# Patient Record
Sex: Female | Born: 1980
Health system: Southern US, Community
[De-identification: ages and names within clinical notes are randomized; demographics above are authoritative.]

## PROBLEM LIST (undated history)

## (undated) VITALS — BP 123/85 | HR 91 | Ht 67.99 in | Wt 172.0 lb

## (undated) HISTORY — PX: NECK SURGERY: SHX720

---

## 2012-11-06 ENCOUNTER — Ambulatory Visit: Payer: Self-pay

## 2012-11-06 ENCOUNTER — Telehealth: Payer: Self-pay

## 2012-11-06 DIAGNOSIS — M542 Cervicalgia: Secondary | ICD-10-CM
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
## 2012-11-06 NOTE — Telephone Encounter (Signed)
Please call the pt back to schedule aqua therapy appt at [phone]
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
## 2012-11-06 NOTE — Progress Notes (Signed)
Physical Therapy Exercise Flowsheet:  *Please refer to Physical Therapy Daily Flowsheet for further details of this session.*   11/06/12 1300   Cervical Exercises   Cervical Retraction, Sitting Comment HEP   Shoulder Exercises   Scapular Retractions, Sitting Comment HEP   Shoulder Circles Comment HEP   additional exercise Doorway Pec Stretch: HEP   Wallene Dales PT, DPT

## 2013-02-05 ENCOUNTER — Ambulatory Visit: Payer: Self-pay | Admitting: Physical Medicine and Rehabilitation

## 2013-04-05 ENCOUNTER — Other Ambulatory Visit: Payer: Self-pay | Admitting: Sports Medicine

## 2013-04-05 DIAGNOSIS — M25561 Pain in right knee: Secondary | ICD-10-CM

## 2013-04-06 ENCOUNTER — Ambulatory Visit: Payer: Self-pay | Admitting: Sports Medicine

## 2013-04-06 ENCOUNTER — Encounter: Payer: Self-pay | Admitting: Sports Medicine

## 2013-04-06 VITALS — BP 112/74 | Ht 68.0 in | Wt 176.0 lb
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
## 2013-04-09 ENCOUNTER — Ambulatory Visit: Payer: Self-pay | Admitting: Rehabilitative and Restorative Service Providers"

## 2013-04-09 DIAGNOSIS — M25569 Pain in unspecified knee: Secondary | ICD-10-CM

## 2013-04-09 NOTE — Progress Notes (Signed)
Physical Therapy Exercise Flowsheet:  *Please refer to Physical Therapy Daily Flowsheet for further details of this session.*     04/09/13 1500   Hip Exercises   Hip Exercises Yes   Hip Abduction Clam, Sidelying Comment HEP   Knee Exercises   Knee Exercises Yes   Quadriceps Set Comment 3 WAY - HEP   Straight Leg Raise Comment HEP     Tressie Ellis, PT, DPT
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
## 2013-04-16 ENCOUNTER — Ambulatory Visit: Payer: Self-pay
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
## 2013-04-16 NOTE — Progress Notes (Signed)
Physical Therapy Exercise Flowsheet:  *Please refer to Physical Therapy Daily Flowsheet for further details of this session.*   04/16/13 1530   Gym Equipment Exercises   Stationary Bike Comment no resistance, 8 min   Total time 8 min   Hip Exercises   Hip Exercises Yes   Hip Abduction Clam, Sidelying Comment 10 reps   Total time 2 min   Knee Exercises   Knee Exercises Yes   Quadriceps Set Comment 20x5sec   Terminal Knee Extension, Supine Comment 10 reps, 2 sets   additional exercise knee flexion stretch, 20 sec, 4 reps   Total time 15 ,om   Foot/Ankle Exercises   Heel Raises, Standing Comment 20 reps   Total time 5 min   Ronte Parker Eulis Foster, PTA

## 2013-04-22 ENCOUNTER — Ambulatory Visit: Payer: Self-pay

## 2013-04-22 NOTE — Progress Notes (Signed)
Physical Therapy Exercise Flowsheet:  *Please refer to Physical Therapy Daily Flowsheet for further details of this session.*   04/22/13 1538   Gym Equipment Exercises   Stationary Bike Comment no resistance, 8 min   Total time 8 min   Hip Exercises   Hip Exercises Yes   Hip Abduction Clam, Sidelying Comment 15 reps   Total time 2 min   Knee Exercises   Knee Exercises Yes   Hamstring Set Comment 20x5sec   Quadriceps Set Comment 20x5sec   Straight Leg Raise Comment 10 reps   Terminal Knee Extension, Supine Comment 20x5sec   additional exercise knee flexion stretch, 20 sec, 4 reps   additional exercise stairs, 20sec, 4 reps   Total time 20 min   Foot/Ankle Exercises   Heel Raises, Standing Comment 10 reps   Total time 3 min   Gatlyn Lipari Eulis Foster, PTA
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
## 2013-04-24 ENCOUNTER — Ambulatory Visit: Payer: Self-pay
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
## 2013-04-27 ENCOUNTER — Ambulatory Visit: Payer: Self-pay

## 2013-04-27 NOTE — Progress Notes (Signed)
Physical Therapy Exercise Flowsheet:  *Please refer to Physical Therapy Daily Flowsheet for further details of this session.*   04/27/13 1400   Gym Equipment Exercises   Stationary Bike Comment no resistance, 10 min   Total time 10 min   Hip Exercises   Hip Exercises Yes   Hip Abduction Clam, Sidelying Comment 15 reps   Total time 2 min   Knee Exercises   Knee Exercises Yes   Hamstring Set Comment 20x5sec   Quadriceps Set Comment 20x5sec   Straight Leg Raise Comment 10 reps   Terminal Knee Extension, Supine Comment 1 1/2#, 20 reps   additional exercise knee flexion stretch, 20 sec, 4 reps   additional exercise stairs, 20sec, 4 reps   Total time 22 min   Foot/Ankle Exercises   Heel Raises, Standing Comment 20 reps   Total time 3 min   Mabel Unrein Eulis Foster, PTA
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
## 2013-04-29 ENCOUNTER — Ambulatory Visit: Payer: Self-pay
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
## 2013-04-29 NOTE — Progress Notes (Signed)
Physical Therapy Exercise Flowsheet:  *Please refer to Physical Therapy Daily Flowsheet for further details of this session.*   04/29/13 1400   Gym Equipment Exercises   Stationary Bike Comment level1, 10 min   Total time 10 min   Hip Exercises   Hip Exercises Yes   Hip Abduction Clam, Sidelying Comment 15 reps   Total time 2 min   Knee Exercises   Knee Exercises Yes   Hamstring Set Comment 20x5sec   Quadriceps Set Comment 20x5sec   Straight Leg Raise Comment 10 reps   Terminal Knee Extension, Supine Comment 1 1/2#, 20 reps   additional exercise knee flexion stretch, 20 sec, 4 reps   additional exercise stairs, 20sec, 4 reps   Total time 22 min   Foot/Ankle Exercises   Heel Raises, Standing Comment 20 reps   Total time 3 min   Yonas Bunda Eulis Foster, PTA

## 2013-05-05 ENCOUNTER — Ambulatory Visit: Payer: Self-pay

## 2013-05-05 DIAGNOSIS — M25569 Pain in unspecified knee: Secondary | ICD-10-CM

## 2013-05-07 ENCOUNTER — Ambulatory Visit: Payer: Self-pay

## 2013-05-07 NOTE — Progress Notes (Addendum)
Department of Physical Medicine & Rehabilitation  Physical Therapy Progress Note    HISTORY:  Diagnosis:    Diagnosis: Right knee pain  Date of Surgery/Onset Date of Symptoms:        11/27/12  Referring Provider:        Glean Salvo, MD  Frequency:    Twice a week  Attendance:    Consistent  Patient's compliance with therapy and home exercise program:      good   Treatment:    AROM/PROM/Therapeutic exercise, Cold pack, Flexibility, Home exercise program instructions/Patient education, Patient/Family Education, Postural training/body Curator education, Strengthening    SUBJECTIVE:  Pain:    Improved     0-10 Scale: 6     OBJECTIVE:  ROM:   ROM  R Knee Flexion (0-140): 125  R Knee Extension (0): 0  Strength:   STRENGTH  Strength: Yes  R Hip Flexion : 4/5;4+/5  R Hip Extension : 4/5  R Hip Abduction : 4/5;4+/5  R Knee Flexion : 4+/5  R Knee Extension : 4+/5  Function:    Improved   Functional Tasks:     Improving with: Improving right knee ROM and decreased pain ambulation. Navigate stairs reciprocally with less pain.      Still difficulty with the following: Long distance walking and squatting     Functional Outcome Measures:    Self-Reported:  Lower Limb Functional scale:50%     Patient Education  Patient Education: Yes  Educated in disease process: Yes  Educated in home exercise program: Yes  Educated in need for compliance with use of pain management techniques: Yes   Educated in use of thermal agents: Yes       ASSESSMENT:  Short term goals achieved, continued PT recommended.    Short term goals (4 weeks):   1. Initiate HEP - Achieved  2. Independent with home thermal agents - Achieved  3. Tolerate >= 10 minutes standing - Achieved    Long term goals (8 weeks):    1. Independent with final HEP - On going  2. 5/5 right knee strength - partially achieved  3. Community ambulation without pain increase - on going/progressing  4. Stair negotiation without pain increase - partially achieved    Plan: Continue  PT     Sherrielyn Eulis Foster, PTA  Tressie Ellis, PT, DPT

## 2013-05-07 NOTE — Progress Notes (Signed)
Physical Therapy Exercise Flowsheet:  *Please refer to Physical Therapy Daily Flowsheet for further details of this session.*   05/05/13 1400   Gym Equipment Exercises   Stationary Bike Comment level 1, 10 min   Total time 10 min   Hip Exercises   Hip Exercises Yes   Hip Abduction Clam, Sidelying Comment 15 reps   Total time 2 min   Knee Exercises   Knee Exercises Yes   Hamstring Set Comment 20x5sec   Quadriceps Set Comment 20x5sec   Straight Leg Raise Comment 10 reps   Terminal Knee Extension, Supine Comment 1 1/2#, 20 reps   additional exercise knee flexion stretch on stairs, 20 sec, 4 reps   additional exercise stairs, 20sec, 4 reps   Total time 22 min   Foot/Ankle Exercises   Heel Raises, Standing Comment 20 reps   Total time 3 min   Jezreel Justiniano Eulis Foster, PTA
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
## 2013-05-08 ENCOUNTER — Ambulatory Visit: Payer: Self-pay
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
## 2013-05-08 NOTE — Progress Notes (Signed)
Physical Therapy Exercise Flowsheet:  *Please refer to Physical Therapy Daily Flowsheet for further details of this session.*   05/07/13 1338   Gym Equipment Exercises   Stationary Bike Comment level 1, 10 min   Total time 10 min   Hip Exercises   Hip Exercises Yes   Hip Abduction Clam, Sidelying Comment 15 reps   Total time 2 min   Knee Exercises   Knee Exercises Yes   Hamstring Set Comment 20x5sec   Quadriceps Set Comment 20x5sec   Straight Leg Raise Comment 10 reps   Terminal Knee Extension, Supine Comment 1 1/2#, 20 reps   additional exercise knee flexion stretch on stairs, 20 sec, 4 reps   additional exercise stairs, 20sec, 4 reps   Total time 22 min   Foot/Ankle Exercises   Heel Raises, Standing Comment 20 reps   Total time 3 min   Hesston Hitchens Eulis Foster, PTA

## 2013-05-13 ENCOUNTER — Telehealth: Payer: Self-pay

## 2013-05-13 ENCOUNTER — Ambulatory Visit: Payer: Self-pay

## 2013-05-13 NOTE — Telephone Encounter (Signed)
Patient called to cancel appointment for today due to the weather
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
## 2013-05-15 ENCOUNTER — Ambulatory Visit: Payer: Self-pay

## 2013-05-15 ENCOUNTER — Telehealth: Payer: Self-pay
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
## 2013-05-15 NOTE — Telephone Encounter (Signed)
The patient cancelled her 2:30 appointment for today.

## 2013-05-18 ENCOUNTER — Encounter: Payer: Self-pay | Admitting: Sports Medicine

## 2013-05-18 ENCOUNTER — Ambulatory Visit: Payer: Self-pay | Admitting: Sports Medicine

## 2013-05-18 VITALS — BP 110/69 | HR 67 | Wt 178.0 lb

## 2013-05-18 DIAGNOSIS — S8000XA Contusion of unspecified knee, initial encounter: Secondary | ICD-10-CM

## 2013-05-18 DIAGNOSIS — M25569 Pain in unspecified knee: Secondary | ICD-10-CM
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
## 2013-06-09 NOTE — Progress Notes (Addendum)
Department of Physical Medicine & Rehabilitation  Discharge Report    History:  Date of initial PT visit: 04/07/13  Date of last PT visit: 05/07/13  # of visits to date: 7  Diagnosis: Right knee pain  Subjective/Objective:  Patient hasn't attended PT in 4 weeks for unknown reasons    Assessment  Progress toward Program goals: Partially achieved or not achieved due to:   unknown  Progress towards Patient goals: Partially achieved or not achieved due to:   unknown     Plan  Discontinue Physical Therapy.    Thank you for the referral.  If you have any questions and/or concerns, please feel free to contact me at [phone].    Sherrielyn Eulis FosterAlberto, PTA    Tressie Ellisonald Mariadel Mruk, PT, DPT

## 2013-06-26 ENCOUNTER — Ambulatory Visit
Admit: 2013-06-26 | Discharge: 2013-06-26 | Disposition: A | Discharge status: Home / Self Care | Payer: Self-pay | Source: Ambulatory Visit | Attending: Neurosurgery | Admitting: Neurosurgery

## 2013-06-26 ENCOUNTER — Encounter: Payer: Self-pay | Admitting: Neurosurgery

## 2013-06-26 DIAGNOSIS — M47812 Spondylosis without myelopathy or radiculopathy, cervical region: Secondary | ICD-10-CM

## 2013-06-26 HISTORY — DX: Spondylosis without myelopathy or radiculopathy, cervical region: M47.812

## 2013-06-26 LAB — BASIC METABOLIC PANEL
Anion Gap: 9 (ref 7–16)
CO2: 25 mmol/L (ref 20–28)
Calcium: 9.5 mg/dL (ref 8.8–10.2)
Chloride: 102 mmol/L (ref 96–108)
Creatinine: 1.05 mg/dL — ABNORMAL HIGH (ref 0.51–0.95)
GFR,Black: 80 *
GFR,Caucasian: 69 *
Glucose: 79 mg/dL (ref 60–99)
Lab: 10 mg/dL (ref 6–20)
Potassium: 4.3 mmol/L (ref 3.3–5.1)
Sodium: 136 mmol/L (ref 133–145)

## 2013-06-26 LAB — PROTIME-INR
INR: 1 (ref 1.0–1.2)
Protime: 10.4 s (ref 9.2–12.3)

## 2013-06-26 LAB — CBC
Hematocrit: 40 % (ref 34–45)
Hemoglobin: 13.8 g/dL (ref 11.2–15.7)
MCH: 33 pg — ABNORMAL HIGH (ref 26–32)
MCHC: 34 g/dL (ref 32–36)
MCV: 95 fL (ref 79–95)
Platelets: 250 10*3/uL (ref 160–370)
RBC: 4.2 MIL/uL (ref 3.9–5.2)
RDW: 12.4 % (ref 11.7–14.4)
WBC: 6.4 10*3/uL (ref 4.0–10.0)

## 2013-06-26 LAB — TYPE AND SCREEN
ABO RH Blood Type: B POS
Antibody Screen: NEGATIVE

## 2013-06-26 LAB — APTT: aPTT: 31.9 s (ref 25.8–37.9)
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
## 2013-06-26 NOTE — Discharge Instructions (Addendum)
Pre-Operative Instructions Record                    HH [id number] MR    PRIOR TO SURGERY    Five days before surgery, you must STOP all aspirin, ibuprofen (Advil, Naproxen, Aleve, Motrin, etc.) and all vitamins and herbal supplements.     YOU MAY TAKE ACETAMINOPHEN (TYLENOL).    FOLLOW YOUR SURGEON'S INSTRUCTIONS IF DIFFERENT THAN ABOVE.      DAY BEFORE SURGERY  1/28    Call [phone] between 1PM and 4PM and select option #1 to receive your arrival/surgery time.        Do not eat anything (including candy or gum) after midnight the night before your surgery.  ____________________________________________________________________________    DAY OF SURGERY  1/29    Up to 4 hours before your surgery time, clear liquids are allowed (unless your doctor tells you differently).      Examples: black coffee or tea (no dairy or non-dairy creamer), soda, water, clear apple or cranberry juice.  No orange or tomato juice.     MEDICATIONS   PLEASE REFER TO THE MEDICATION LIST ON THE ATTACHED PAGE AND ONLY TAKE THE MEDICATIONS MARKED ON THAT LIST.   DO NOT TAKE ANY MEDICATIONS THAT WERE ALREADY STOPPED (ABOVE).   Bring and use inhalers as needed.   Pain and anxiety medications may be taken with a sip of water at any time.        DO NOT WEAR ANY RINGS, JEWELRY, MAKEUP, DARK NAIL POLISH, HAIR PINS, BODY LOTION OR SCENTS.      You may brush your teeth and use deodorant.  If wearing eyeglasses, please bring a case.  DO NOT WEAR CONTACT LENSES.     __ BRING CPAP IF USING       _X_ Skin prep and instructions given           __ Bring brace into the hospital the day of surgery  ________________________________________________________________________________    AT THE HOSPITAL  Park in the Main Ramp garage.  Report [facility] on [address].  Leave your belongings in the car and your visitors can bring them to your room after  surgery.    Any questions? Call [phone], select option 1, and ask to speak with a nurse or call your surgeon.    The patient  has participated in the development of this discharge plan and the above material has been reviewed.  Questions have been answered and he/she understands the contents of this plan and has received a copy of it.  Jeanella CaraJanene M Lyndell Allaire, RN 06/26/2013 1:06 PM        ON THE DAY OF YOUR SURGERY, FROM THE LIST OF MEDICATIONS BELOW, TAKE ONLY THOSE MEDICATIONS CHECKED.

## 2013-06-29 LAB — VITAMIN D
25-OH VIT D2: <4 ng/mL
25-OH VIT D3: 8 ng/mL
25-OH Vit Total: 8 ng/mL — ABNORMAL LOW (ref 30–60)

## 2013-07-02 ENCOUNTER — Other Ambulatory Visit: Payer: Self-pay | Admitting: Neurosurgery

## 2013-07-02 ENCOUNTER — Inpatient Hospital Stay
Admit: 2013-07-02 | Disposition: A | Discharge status: Home / Self Care | Payer: Self-pay | Source: Ambulatory Visit | Attending: Neurosurgery | Admitting: Neurosurgery

## 2013-07-02 ENCOUNTER — Encounter: Payer: Self-pay | Admitting: Neurosurgery

## 2013-07-02 DIAGNOSIS — M5030 Other cervical disc degeneration, unspecified cervical region: Secondary | ICD-10-CM | POA: Diagnosis present

## 2013-07-02 DIAGNOSIS — M542 Cervicalgia: Secondary | ICD-10-CM | POA: Diagnosis present

## 2013-07-02 DIAGNOSIS — M47812 Spondylosis without myelopathy or radiculopathy, cervical region: Secondary | ICD-10-CM

## 2013-07-02 DIAGNOSIS — M5020 Other cervical disc displacement, unspecified cervical region: Secondary | ICD-10-CM | POA: Diagnosis present

## 2013-07-02 DIAGNOSIS — F17200 Nicotine dependence, unspecified, uncomplicated: Secondary | ICD-10-CM | POA: Diagnosis present

## 2013-07-02 HISTORY — PX: OTHER SURGICAL HISTORY: SHX169

## 2013-07-02 LAB — POCT URINE PREGNANCY: Lot #: 104663

## 2013-07-02 LAB — POCT GLUCOSE
Glucose POCT: 105 mg/dL — ABNORMAL HIGH (ref 60–99)
Glucose POCT: 120 mg/dL — ABNORMAL HIGH (ref 60–99)

## 2013-07-02 MED ORDER — HYDROCODONE-ACETAMINOPHEN 5-325 MG PO TABS *I*
2.0000 | ORAL_TABLET | ORAL | Status: DC | PRN
Start: 2013-07-02 — End: 2013-07-03
  Administered 2013-07-02 – 2013-07-03 (×6): 2 via ORAL
  Filled 2013-07-02 (×6): qty 2

## 2013-07-02 MED ORDER — BUPIVACAINE-EPINEPHRINE 0.5 % IJ SOLN
INTRAMUSCULAR | Status: AC
Start: 2013-07-02 — End: 2013-07-02
  Filled 2013-07-02: qty 30

## 2013-07-02 MED ORDER — LACTATED RINGERS IV SOLN *I*
75.0000 mL/h | INTRAVENOUS | Status: DC
Start: 2013-07-02 — End: 2013-07-03
  Administered 2013-07-02: 75 mL/h via INTRAVENOUS

## 2013-07-02 MED ORDER — PROMETHAZINE HCL 25 MG/ML IJ SOLN *I*
6.2500 mg | Freq: Once | INTRAMUSCULAR | Status: DC | PRN
Start: 2013-07-02 — End: 2013-07-02
  Filled 2013-07-02: qty 1

## 2013-07-02 MED ORDER — EPHEDRINE SULFATE 50 MG/ML IJ SOLN WRAPPED *I*
Status: AC
Start: 2013-07-02 — End: 2013-07-02
  Filled 2013-07-02: qty 1

## 2013-07-02 MED ORDER — FENTANYL CITRATE 50 MCG/ML IJ SOLN *WRAPPED*
INTRAMUSCULAR | Status: AC
Start: 2013-07-02 — End: 2013-07-02
  Filled 2013-07-02: qty 2

## 2013-07-02 MED ORDER — DEXAMETHASONE SODIUM PHOSPHATE 4 MG/ML IJ SOLN
INTRAMUSCULAR | Status: AC
Start: 2013-07-02 — End: 2013-07-02
  Filled 2013-07-02: qty 1

## 2013-07-02 MED ORDER — SODIUM CHLORIDE 0.9 % INJ (FLUSH) WRAPPED *I*
Status: AC
Start: 2013-07-02 — End: 2013-07-02
  Filled 2013-07-02: qty 10

## 2013-07-02 MED ORDER — MENTHOL THROAT LOZENGE *I*
1.0000 | LOZENGE | OROMUCOSAL | Status: DC | PRN
Start: 2013-07-02 — End: 2013-07-03
  Administered 2013-07-02 – 2013-07-03 (×2): 1 via BUCCAL

## 2013-07-02 MED ORDER — POVIDONE-IODINE 5 % EX SOLN *I*
Freq: Once | CUTANEOUS | Status: AC
Start: 2013-07-02 — End: 2013-07-02

## 2013-07-02 MED ORDER — FENTANYL CITRATE 50 MCG/ML IJ SOLN *WRAPPED*
INTRAMUSCULAR | Status: AC
Start: 2013-07-02 — End: 2013-07-02
  Filled 2013-07-02: qty 4

## 2013-07-02 MED ORDER — ROCURONIUM BROMIDE 10 MG/ML IV SOLN *WRAPPED*
Status: AC
Start: 2013-07-02 — End: 2013-07-02
  Filled 2013-07-02: qty 5

## 2013-07-02 MED ORDER — ONDANSETRON HCL 2 MG/ML IV SOLN *I*
4.0000 mg | Freq: Four times a day (QID) | INTRAMUSCULAR | Status: DC | PRN
Start: 2013-07-02 — End: 2013-07-03

## 2013-07-02 MED ORDER — NEOSTIGMINE METHYLSULFATE 1 MG/ML IJ SOLN WRAPPED *I*
Status: AC
Start: 2013-07-02 — End: 2013-07-02
  Filled 2013-07-02: qty 10

## 2013-07-02 MED ORDER — ONDANSETRON HCL 2 MG/ML IV SOLN *I*
INTRAMUSCULAR | Status: AC
Start: 2013-07-02 — End: 2013-07-02
  Filled 2013-07-02: qty 2

## 2013-07-02 MED ORDER — LACTATED RINGERS IV SOLN *I*
20.0000 mL/h | INTRAVENOUS | Status: DC
Start: 2013-07-02 — End: 2013-07-02
  Administered 2013-07-02: 20 mL/h via INTRAVENOUS

## 2013-07-02 MED ORDER — ZOLPIDEM TARTRATE 5 MG PO TABS *I*
5.0000 mg | ORAL_TABLET | Freq: Once | ORAL | Status: AC
Start: 2013-07-02 — End: 2013-07-02
  Administered 2013-07-02: 5 mg via ORAL
  Filled 2013-07-02: qty 1

## 2013-07-02 MED ORDER — LIDOCAINE HCL 1 % IJ SOLN
0.1000 mL | INTRAMUSCULAR | Status: DC | PRN
Start: 2013-07-02 — End: 2013-07-02
  Administered 2013-07-02: 0.1 mL via SUBCUTANEOUS
  Filled 2013-07-02: qty 2

## 2013-07-02 MED ORDER — CEFAZOLIN 2000 MG IN 100 ML D5W *I*
2000.0000 mg | INTRAVENOUS | Status: AC
Start: 2013-07-02 — End: 2013-07-02
  Administered 2013-07-02: 2000 mg via INTRAVENOUS
  Filled 2013-07-02: qty 1

## 2013-07-02 MED ORDER — DOCUSATE SODIUM 100 MG PO CAPS *I*
100.0000 mg | ORAL_CAPSULE | Freq: Two times a day (BID) | ORAL | Status: DC
Start: 2013-07-02 — End: 2013-07-03
  Administered 2013-07-02 – 2013-07-03 (×2): 100 mg via ORAL
  Filled 2013-07-02 (×2): qty 1

## 2013-07-02 MED ORDER — MIDAZOLAM HCL 1 MG/ML IJ SOLN *I* WRAPPED
INTRAMUSCULAR | Status: AC
Start: 2013-07-02 — End: 2013-07-02
  Filled 2013-07-02: qty 2

## 2013-07-02 MED ORDER — LACTATED RINGERS IV SOLN *I*
125.0000 mL/h | INTRAVENOUS | Status: DC
Start: 2013-07-02 — End: 2013-07-02
  Administered 2013-07-02: 125 mL/h via INTRAVENOUS

## 2013-07-02 MED ORDER — KETOROLAC TROMETHAMINE 30 MG/ML IJ SOLN *I*
INTRAMUSCULAR | Status: AC
Start: 2013-07-02 — End: 2013-07-02
  Filled 2013-07-02: qty 1

## 2013-07-02 MED ORDER — ERGOCALCIFEROL 50000 UNIT PO CAPS *I*
50000.0000 [IU] | ORAL_CAPSULE | Freq: Every day | ORAL | Status: AC
Start: 2013-07-03 — End: 2013-07-03
  Administered 2013-07-03: 50000 [IU] via ORAL
  Filled 2013-07-02 (×2): qty 1

## 2013-07-02 MED ORDER — HYDROCODONE-ACETAMINOPHEN 5-325 MG PO TABS *I*
1.0000 | ORAL_TABLET | ORAL | Status: DC | PRN
Start: 2013-07-02 — End: 2013-07-03

## 2013-07-02 MED ORDER — PHENOL 1.4 % MT LIQD *I*
1.0000 | OROMUCOSAL | Status: DC | PRN
Start: 2013-07-02 — End: 2013-07-03
  Administered 2013-07-03: 1 via ORAL
  Filled 2013-07-02: qty 177

## 2013-07-02 MED ORDER — PROPOFOL 10 MG/ML IV EMUL (INTERMITTENT DOSING) WRAPPED *I*
INTRAVENOUS | Status: AC
Start: 2013-07-02 — End: 2013-07-02
  Filled 2013-07-02: qty 20

## 2013-07-02 MED ORDER — HYDROMORPHONE HCL PF 1 MG/ML IJ SOLN *WRAPPED*
INTRAMUSCULAR | Status: AC
Start: 2013-07-02 — End: 2013-07-02
  Filled 2013-07-02: qty 1

## 2013-07-02 MED ORDER — MEPERIDINE HCL 25 MG/ML IJ SOLN *I*
12.5000 mg | INTRAMUSCULAR | Status: DC | PRN
Start: 2013-07-02 — End: 2013-07-02

## 2013-07-02 MED ORDER — SENNOSIDES 8.6 MG PO TABS *I*
2.0000 | ORAL_TABLET | Freq: Every evening | ORAL | Status: DC
Start: 2013-07-02 — End: 2013-07-03
  Administered 2013-07-02: 2 via ORAL
  Filled 2013-07-02: qty 2

## 2013-07-02 MED ORDER — LIDOCAINE HCL 2 % (PF) IJ SOLN *I*
INTRAMUSCULAR | Status: AC
Start: 2013-07-02 — End: 2013-07-02
  Filled 2013-07-02: qty 5

## 2013-07-02 MED ORDER — DIAZEPAM 5 MG PO TABS *I*
5.0000 mg | ORAL_TABLET | Freq: Four times a day (QID) | ORAL | Status: DC | PRN
Start: 2013-07-02 — End: 2013-07-03
  Administered 2013-07-02 – 2013-07-03 (×4): 5 mg via ORAL
  Filled 2013-07-02 (×4): qty 1

## 2013-07-02 MED ORDER — HYDROMORPHONE HCL PF 1 MG/ML IJ SOLN *WRAPPED*
0.4000 mg | INTRAMUSCULAR | Status: DC | PRN
Start: 2013-07-02 — End: 2013-07-02
  Administered 2013-07-02 (×5): 0.4 mg via INTRAVENOUS
  Filled 2013-07-02 (×2): qty 1

## 2013-07-02 MED ORDER — THROMBIN (RECOMBINANT) 5000 UNIT EX SOLR *I* WRAPPED
CUTANEOUS | Status: AC
Start: 2013-07-02 — End: 2013-07-02
  Filled 2013-07-02: qty 2

## 2013-07-02 MED ORDER — SUCCINYLCHOLINE CHLORIDE 20 MG/ML IV/IJ SOLN *WRAPPED*
Status: AC
Start: 2013-07-02 — End: 2013-07-02
  Filled 2013-07-02: qty 10

## 2013-07-02 NOTE — Progress Notes (Signed)
Patient arrived to PACU, report from Dr. Carlena Bjornstadurle.  VSS.  Patient awake, alert, and oriented.  Patient rating pain 9/10, states it is the same as her baseline pain.  BG checked, found to be 120mg /dL.  No treatment necessary per Dr. Carlena Bjornstadurle.  Will continue to monitor until patient meets criteria for discharge from PACU.  Saunders GlanceElizabeth Angeldejesus Callaham, RN

## 2013-07-02 NOTE — Preop H&P (Signed)
UPDATES TO PATIENT'S CONDITION on the DAY OF SURGERY/PROCEDURE    I. Updates to Patient's Condition (to be completed by a provider privileged to complete a H&P, following reassessment of the patient by the provider):    Full H&P done today; no updates needed.            II. Procedure Readiness   I have reviewed the patient's H&P and updated condition. By completing and signing this form, I attest that this patient is ready for surgery/procedure.      III. Attestation   I have reviewed the updated information regarding the patient's condition and it is appropriate to proceed with the planned surgery/procedure.    Mariana SingleHRISTIE M Aniket Paye, MD as of 8:11 AM 07/02/2013

## 2013-07-02 NOTE — INTERIM OP NOTE (Signed)
Interim Op Note    Date of Surgery: 07/02/2013  Surgeon: McMorrow, Cathlean Sauerhristie M, MD    First Assistant: Wendee CoppMeghan Syria Kestner, PA      Pre-Op Diagnosis: cervical spondylosis    Anesthesia Type: General    Post-Op Diagnosis:    Primary: same      Additional Findings (Including unexpected complications): none    Procedure(s) Performed (including CPT 4 Code if available)   ACDF C4-C7    Estimated Blood Loss: 30 cc  Packing: No  Drains: No  Fluid Totals: Intakes: 1100 cc Outputs: 200 cc  Specimens to Pathology: no  Patient Condition: good

## 2013-07-02 NOTE — Progress Notes (Signed)
preop vitamin d 8    Ergocalciferol 50000units 07/03/13  Start supplement at discharge  Vitamin D3 10000 units per week      [facility] Surgery CenterChristie McMorrowMD

## 2013-07-02 NOTE — Anesthesia Pre-procedure Eval (Addendum)
Anesthesia Pre-operative Evaluation for [place] History  Past Medical History   Diagnosis Date    Cervical spondylosis without myelopathy 06/26/2013     No past surgical history on file.  Social History  History   Substance Use Topics    Smoking status: Current Every Day Smoker -- 0.05 packs/day    Smokeless tobacco: Never Used    Alcohol Use: Yes      Comment: beer daily      History   Drug Use    Yes    Special: Marijuana     ______________________________________________________________________  Allergies: No Known Allergies (drug, envir, food or latex)  Prior to Admission Medications              Last Dose Start Date End Date Provider     Pediatric Multivit-Minerals-C (RA GUMMY VITAMINS & MINERALS PO) 1 week  --  --  [provider]        Current Facility-Administered Medications   Medication    Lactated Ringers Infusion    lidocaine 1 % injection 0.1 mL    ceFAZolin 2 g in D5W IVPB    povidone-iodine 5 % Skin Preparation SOLN    Lactated Ringers Infusion    HYDROmorphone PF (DILAUDID) injection 0.4 mg    promethazine (PHENERGAN) injection 6.25 mg    meperidine (DEMEROL) injection 12.5 mg     Admission Medications:  Scheduled Meds   cefazolin IV      povidone-iodine     IV Meds   lactated ringers      lactated ringers     PRN Meds   lidocaine      HYDROmorphone PF      promethazine      meperidine       Anesthesia Evaluation Information Source: per patient, per records  GENERAL     Denies general issues  Pertinent (-):  history of anesthetic complications, FamHx of anesthetic complications    HEENT  Denies HEENT issues    PULMONARY    + Smoker            currently, advised to quit    + Recent URI (nasal congestion, no fever, no productive cough) CARDIOVASCULAR  Denies cardiovascular issues  Good(4+METs) Exercise Tolerance    GI/HEPATIC/RENAL     Denies GI/hepatic/renal issues  Last PO Intake: >8hr before procedure     NEURO/PSYCH    Comment: Bilateral upper  extremity pain and numbness L>R, no exacerbation with motion in neck    ENDO/OTHER    Denies endo issues  Pertinent (-):  diabetes mellitus    HEMATOLOGIC  Denies hematologic issues     Nursing Reported PO Status: Date Last PO Fluids: 07/02/13 0630  Date Last PO Solids: 07/01/13 2300  ______________________________________________________________________  Physical Exam    Airway            Mouth opening: normal            Mallampati: I            TM distance (fb): >3 FB            Neck ROM: full  Dental   Normal Exam   Cardiovascular  Normal Exam     Pulmonary   pulmonary exam normal    Mental Status     + Anxious         Most Recent Vitals: BP: 157/93 mmHg (07/02/13 0705)  Heart Rate: 86 (07/02/13 0705)  Temp: 36.7 C (98.1 F) (07/02/13 0705)  Resp: 16 (07/02/13 0705)  Height: 172.7 cm ($RemoveB'5\' 8"'MxFgZKwo$ ) (07/02/13 0705)  Weight: 78.019 kg (172 lb) (07/02/13 0705)  BMI (Calculated): 26.2 (07/02/13 0705)  BSA (Calculated - sq m): 1.93 sq meters (07/02/13 0705)  SpO2: 97 % (07/02/13 0705)    Vital Sign Ranges (last 24hrs)  Temp:  [36.7 C (98.1 F)] 36.7 C (98.1 F)  Heart Rate:  [86] 86  Resp:  [16] 16  BP: (157)/(93) 157/93 mmHg        Most Recent Lab Results   Blood Type  Lab Results   Component Value Date    ABORH B RH POS 06/26/2013    ABS Negative 06/26/2013   CBC  Lab Results   Component Value Date    WBC 6.4 06/26/2013    HCT 40 06/26/2013    PLT 250 06/26/2013   Chem-7  Lab Results   Component Value Date    NA 136 06/26/2013    K 4.3 06/26/2013    CL 102 06/26/2013    CO2 25 06/26/2013    UN 10 06/26/2013    CREAT 1.05* 06/26/2013    GLU 79 06/26/2013   Estimated Creatinine Clearance: 76.16 ml/min (based on Cr of 1.05).  Electrolytes  Lab Results   Component Value Date    CA 9.5 06/26/2013   Coags  Lab Results   Component Value Date    PTI 10.4 06/26/2013    INR 1.0 06/26/2013    PTT 31.9 06/26/2013   LFTs  No results found for this basename: AST, ALT, ALK      Pregnancy Status: Postmenarcheal [5]  Patient's last menstrual period was  07/02/2013.    Lab Results   Component Value Date    PUPT Negative-Dilute urine specimens may cause false negative urine pregnancy results... 07/02/2013     ECG Results  No results found for this basename: rate, PR, statement     ANES CPM    Radiology: No relevant studies     ________________________________________________________________________  Medical Problems  Patient Active Problem List    Diagnosis Date Noted    Cervical spondylosis without myelopathy 06/26/2013    Pain in joint, lower leg 04/06/2013    Contusion of knee 04/06/2013    Patellar pain 04/06/2013       PreOp/PreProcedure Diagnosis (For more detail see procedural consent)            Cervical spondylolisthesis  Planned Procedure (For more detail see procedural consent)            ACDF C4-7  Plan   ASA Score 2  Anesthetic Plan (general); Induction (routine IV); Airway (cuffed ETT); Line ( use current access); Monitoring (standard ASA); Positioning (supine); Pain (per surgical team); PostOp (PACU)    Informed Consent     Risks:          Risks discussed were commensurate with the plan listed above with the following specific points:  N/V, sore throat, unexpected serious injury    Anesthetic Consent:         Anesthetic plan and risks discussed with:  patient      Attending Attestation: The patient or proxy understand and accept the risks and benefits of the anesthesia plan. By accepting this note, I attest that I have personally performed the history and physical exam and prescribed the anesthetic plan within 48 hours prior to the anesthetic as documented by me above.    Author: Will Bonnet,  MD

## 2013-07-02 NOTE — Discharge Instructions (Signed)
Post-op Instructions:  Anterior Cervical Decompression and Fusion  Dr. McMorrow [phone]    Wound Care  Keep incision clean and dry   Wash with soap and water, dry with towel  Do not remove paper steri -strips  Do not remove or cut blue sutures  Men may shave neck around area of paper steri-strips  Do not apply any creams, ointments, etc to incision    General Instructions  Wear collar when out of bed  The collar should fit snug and restrict the motion of your neck  You may shower  Do not swim or immerse the incision in water  Use the incentive Spirometer (plastic blow box) 4 times per day for 2 weeks  Walk thirty (30) minutes every morning and evening    Do not use hand weights   Walk on easy terrain  Do not smoke cigarettes or use nicotine products  Do not use non-steroidal anti-inflammatory agents (NSAID’s) ie Motrin, Advil, Naprosyn, ibuprofen, etc  Eat a well balanced diet  Your throat may be sore for a week   Try to eat easy to swallow foods   Drink liquids with meals  Do not drive  Do not lift anything heavier than 5 pounds  No strenuous or contact sports  You will be given a prescription for pain medication; this medication should last until your return visit  You may only require Tylenol for pain relief    Call Dr. McMorrow if any of the following occur  You develop a fever (temperature greater than 101.5), chills, nausea, vomiting  Your incision is red or starts to drain fluid  Your incision is excessively painful  You develop excessive neck pain or difficulty swallowing  Your arms or legs become weak or painful  You are concerned that something is wrong or do not understand activity restrictions    Return Visit  You will be seen in clinic 2 weeks after your surgery  Call office if you do not have 2 week follow up visit scheduled  You will need Xrays prior to your visit  Bring all your medications to follow up visits    Subsequent Care  Please take Vitamin D3 as prescribed  You may be prescribed a  Bone Growth Stimulator;  wear device daily as instructed

## 2013-07-02 NOTE — Plan of Care (Signed)
Problem: Nutrition  Goal: Patients nutritional status is maintained or improved  Outcome: Progressing towards goal  Pt having difficulty swallowing 2/2 throat pain; throat lozenges requested from covering provider and prn order placed. Ice chips given to pt as well as lozenge, both provide some relief. Pt states she is hungry, but having difficulty tolerating regular diet 2/2 swallowing pain. Milkshake made which pt able to tolerate. Prn pain meds admin per MAR> Continue to monitor and assess.
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
## 2013-07-02 NOTE — Progress Notes (Addendum)
Progress Note    S: The patient was irritated that so many people are in and out of her room checking on her. It was explained that she just had surgery and that it is the protocol, and overnight she should be able to sleep more. She states that the pain that she was experiencing in her Left arm and right shoulder is still there, but denies any new numbness or tingling.  Has some difficulty with swallowing, but able to get her medication down. States that she has not been out of bed at this point. Denies any CP, SOB, N/V, or any new paresthesais.     O:BP 119/79   Pulse 59   Temp(Src) 36.8 C (98.2 F) (Temporal)   Resp 16   Ht 1.727 m (5\' 8" )   Wt 78.019 kg (172 lb)   BMI 26.16 kg/m2   SpO2 96%   LMP 07/02/2013  General: NAD while in bed  Neck: dressing C/D/I  UE: Strength +3/5 on left, +4/5 on right, sensation grossly intact bilaterally   Heart: RRR  Lungs: CTAB  Abdomen: +BS, soft round, non tender  No calve tenderness      Intake/Output Summary (Last 24 hours) at 07/02/13 1436  Last data filed at 07/02/13 1137   Gross per 24 hour   Intake   1260 ml   Output    230 ml   Net   1030 ml     Recent Results (from the past 24 hour(s))   POCT URINE PREGNANCY    Collection Time     07/02/13  7:38 AM       Result Value Range    Preg Test,UR POC    Negative-Dilute urine specimens may cause false negative urine pregnancy results...    Value: Negative-Dilute urine specimens may cause false negative urine pregnancy results...    INTERNAL CONTROL POCT URINE PREGNANCY *Yes-internal procedural control(s) acceptable      Exp date 01/26/14      Lot # [id number]     POCT GLUCOSE    Collection Time     07/02/13  7:55 AM       Result Value Range    Glucose POCT 105 (*) 60 - 99 mg/dL   POCT GLUCOSE    Collection Time     07/02/13 11:03 AM       Result Value Range    Glucose POCT 120 (*) 60 - 99 mg/dL     A/P: POD 0 S/P ACDF C4-7  -Cervical films this evening  -encouraged ambulation and IS  -DAT  -PO pain management    -Ergocalciferol  50000units 07/03/13,Start supplement at discharge, Vitamin D3 10000 units per week per Dr.McMorrow  -likely home in the am     Rosalita LevanSamantha Makinley Muscato, [place]PA  NPI # [id number]  Pager # [phone]

## 2013-07-02 NOTE — Plan of Care (Signed)
Patient meets criteria for discharge from PACU.  VSS.  Patient rates pain 8/10, denies nausea.  Patient states pain is better than it is on a daily basis at home.  Patient confirmed to E703.  Brief report to Reece Levyeb, Charity fundraiserN.  Please call Marisue IvanLiz at [phone] with questions.  Saunders GlanceElizabeth Jacier Gladu, RN

## 2013-07-02 NOTE — Plan of Care (Signed)
Problem: Safety  Goal: Patient will remain free of falls  Outcome: Maintaining  Call bell within reach, pt uses call bell appropriately. Pt free of falls this shift.    Problem: Pain/Comfort  Goal: Patients pain or discomfort is manageable  Outcome: Progressing towards goal  PRN pain medications and ice packs used for neck/throat pain with some positive effects. Will continue to monitor.    Problem: Mobility  Goal: Patients functional status is maintained or improved  Outcome: Progressing towards goal  Pt is a standby assist due to the IV pole and risk of falls from narcotics she is taking for pain management.

## 2013-07-03 MED ORDER — VITAMIN D3 10000 UNIT PO CAPS *A*
10000.0000 [IU] | ORAL_CAPSULE | ORAL | Status: DC
Start: 2013-07-03 — End: 2022-11-16

## 2013-07-03 MED ORDER — DOCUSATE SODIUM 100 MG PO CAPS *I*
100.0000 mg | ORAL_CAPSULE | Freq: Two times a day (BID) | ORAL | Status: DC
Start: 2013-07-03 — End: 2022-11-16

## 2013-07-03 MED ORDER — DIAZEPAM 5 MG PO TABS *I*
5.0000 mg | ORAL_TABLET | Freq: Four times a day (QID) | ORAL | Status: DC | PRN
Start: 2013-07-03 — End: 2022-11-16

## 2013-07-03 MED ORDER — HYDROCODONE-ACETAMINOPHEN 5-325 MG PO TABS *I*
1.0000 | ORAL_TABLET | ORAL | Status: DC | PRN
Start: 2013-07-03 — End: 2022-11-16

## 2013-07-03 NOTE — Progress Notes (Signed)
Surgery PA: POD#1 anterior cervical discectomy with fusion C4-5, 5-6, 6-7    S: Feels "shi---". C/O pain in posterior neck, shoulders; mod odynophagia. LUE "still numb" but no worse compared to pre-op. Ambulating about room, to BR. + voids. Dizzy only first time OOB.     O:BP 109/66   Pulse 67   Temp(Src) 37 C (98.6 F) (Temporal)   Resp 16   Ht 1.727 m (5\' 8" )   Wt 78.019 kg (172 lb)   BMI 26.16 kg/m2   SpO2 99%   LMP 07/02/2013                 Intake/Output Summary (Last 24 hours) at 07/03/13 0734  Last data filed at 07/03/13 0640   Gross per 24 hour   Intake 3048.75 ml   Output    805 ml   Net 2243.75 ml           Pt awake and alert, in NAD. Skin warm and dry. Voice clear, strong       Neck wound flat, steri-strips in place. Approp tenderness surrounding incision       Chest is CTAB       Heart with RRR       Grips fair, R>>L. Good shrug       Calves soft, nontender     xrays reviewed, look good    A/P: Cheryl Rush is a 33 y.o. female who is POD#1 in stable condition  -d/c restrictions, instructions r/w pt  -f/u with Dr. Sonnie AlamoMcMorrow in 2 weeks and PRN    Chryl HeckJessica R. Leary RocaMahoney, RPA-C  NPI # [id number]  Pager # [phone]

## 2013-07-03 NOTE — Progress Notes (Signed)
Utilization Management    Level of Care Inpatient as of the date [id number]      Merari Pion J Johnpaul Gillentine, RN     Pager: [phone]

## 2013-07-03 NOTE — Discharge Summary (Signed)
Name: Cheryl Rush Plaisted MRN: [id number] DOB: 11/22/1980     Admit Date: 07/02/2013   Date of Discharge: 07/03/2013    Discharge Attending Physician: Mariana SingleMCMORROW, CHRISTIE M      Hospitalization Summary    CONCISE NARRATIVE: Cheryl Rush Ryker was admitted after the planned surgery. She progressed well following. Her pain was managed with oral Norco and Valium PRN. She was tolerating liquids and soft foods well, ambulating and voiding with ease. There was no evidence of hematoma. She was stable for discharge home on post-op day #1.       OR PROCEDURE: Anterior cervical discectomy with fusion C4-5, 5-6, 6-7      Signed: Marlin Jarrard, PA  On: 07/03/2013  at: 8:17 AM

## 2013-07-03 NOTE — Plan of Care (Signed)
Problem: Pain/Comfort  Goal: Patients pain or discomfort is manageable  Outcome: Adequate for discharge Date Met:  07/03/13  Reviewed pain management for d/c w pt, questions answered to the best of writer's ability. Safe for d/c home.

## 2013-07-03 NOTE — Progress Notes (Signed)
Feeling better now  Neck is sore in back  No shocks into arms when she moves neck  Some persistent numbness  Hurts to swallow    Incision clean and dry  Voice ok  amb ind  Can elevate arms over head    Sp acdf 45 56 67  Doing ok  Encouraged to eat soft foods  No smoking  Numbness may persist   Pain seems to be a little better  Fu office 2 weeks  Start Vitamin D supplement    [facility] CfChristie McMorrowMD

## 2013-07-03 NOTE — Anesthesia Post-procedure Eval (Signed)
Anesthesia Post-op Note    Patient: Cheryl Rush    Procedure(s) Performed: ACDF C4-7    Anesthesia type: General    Patient location: Med Surgical Floor    Mental Status: Recovered to baseline    Patient able to participate in this evaluation: yes  Last Vitals: BP: 109/66 mmHg (07/03/13 0607)  BP MAP : 92 mmHg (07/02/13 1145)  Heart Rate: 67 (07/03/13 0607)  Temp: 37 C (98.6 F) (07/03/13 96040607)  Resp: 16 (07/03/13 0607)  Height: 172.7 cm (5\' 8" ) (07/02/13 0705)  Weight: 78.019 kg (172 lb) (07/02/13 0705)  BMI (Calculated): 26.2 (07/02/13 0705)  BSA (Calculated - sq m): 1.93 sq meters (07/02/13 0705)  SpO2: 99 % (07/03/13 0607)      Post-op vital signs noted above are within patients normal range  Post-op vitals signs: stable  Respiratory function: baseline    Airway patent: Yes    Cardiovascular and hydration status stable: Yes    Post-Op pain: Adequate analgesia    Post-Op nausea and vomiting: none    Post-Op assessment: no apparent anesthetic complications, tolerated procedure well and no evidence of recall    Complications: none, pt doing very well post op    Attending Attestation: All indicated post anesthesia care provided    Author: Junius ArgyleALAN E Floyde Dingley, MD  as of: 07/03/2013  at: 8:27 AM

## 2013-07-03 NOTE — Plan of Care (Signed)
Problem: Pain/Comfort  Goal: Patients pain or discomfort is manageable  Outcome: Progressing towards goal  Pt reports 9/10 pain, but sleeps in between doses of pain medicine.  Most pain complaint of sore throat.  Cepastat lozenge given this morning.

## 2013-11-23 ENCOUNTER — Encounter: Payer: Self-pay | Admitting: Neurosurgery

## 2014-03-08 ENCOUNTER — Encounter: Payer: Self-pay | Admitting: Gastroenterology

## 2014-03-15 ENCOUNTER — Ambulatory Visit: Payer: Self-pay | Admitting: Pain Medicine

## 2014-03-18 ENCOUNTER — Ambulatory Visit: Payer: Self-pay | Admitting: Pain Medicine

## 2014-03-31 ENCOUNTER — Ambulatory Visit: Payer: Self-pay | Admitting: Pain Medicine

## 2014-05-13 ENCOUNTER — Ambulatory Visit: Payer: Self-pay | Admitting: Pain Medicine

## 2016-03-06 ENCOUNTER — Encounter (HOSPITAL_COMMUNITY): Payer: Self-pay | Admitting: Emergency Medicine

## 2016-03-06 ENCOUNTER — Emergency Department (HOSPITAL_COMMUNITY)
Admission: EM | Admit: 2016-03-06 | Discharge: 2016-03-06 | Disposition: A | Discharge status: Home / Self Care | Payer: Self-pay | Attending: Emergency Medicine | Admitting: Emergency Medicine

## 2016-03-06 DIAGNOSIS — Y999 Unspecified external cause status: Secondary | ICD-10-CM | POA: Insufficient documentation

## 2016-03-06 DIAGNOSIS — B9689 Other specified bacterial agents as the cause of diseases classified elsewhere: Secondary | ICD-10-CM | POA: Insufficient documentation

## 2016-03-06 DIAGNOSIS — Y939 Activity, unspecified: Secondary | ICD-10-CM | POA: Insufficient documentation

## 2016-03-06 DIAGNOSIS — G8929 Other chronic pain: Secondary | ICD-10-CM | POA: Insufficient documentation

## 2016-03-06 DIAGNOSIS — F17200 Nicotine dependence, unspecified, uncomplicated: Secondary | ICD-10-CM | POA: Insufficient documentation

## 2016-03-06 DIAGNOSIS — N760 Acute vaginitis: Secondary | ICD-10-CM | POA: Insufficient documentation

## 2016-03-06 DIAGNOSIS — W57XXXA Bitten or stung by nonvenomous insect and other nonvenomous arthropods, initial encounter: Secondary | ICD-10-CM | POA: Insufficient documentation

## 2016-03-06 DIAGNOSIS — Y929 Unspecified place or not applicable: Secondary | ICD-10-CM | POA: Insufficient documentation

## 2016-03-06 DIAGNOSIS — S80861A Insect bite (nonvenomous), right lower leg, initial encounter: Secondary | ICD-10-CM | POA: Insufficient documentation

## 2016-03-06 DIAGNOSIS — M542 Cervicalgia: Secondary | ICD-10-CM | POA: Insufficient documentation

## 2016-03-06 DIAGNOSIS — S80862A Insect bite (nonvenomous), left lower leg, initial encounter: Secondary | ICD-10-CM | POA: Insufficient documentation

## 2016-03-06 LAB — URINALYSIS, ROUTINE W REFLEX MICROSCOPIC
BILIRUBIN URINE: NEGATIVE
Glucose, UA: NEGATIVE mg/dL
HGB URINE DIPSTICK: NEGATIVE
Ketones, ur: NEGATIVE mg/dL
Nitrite: NEGATIVE
PROTEIN: NEGATIVE mg/dL
Specific Gravity, Urine: 1.041 — ABNORMAL HIGH (ref 1.005–1.030)
pH: 7 (ref 5.0–8.0)

## 2016-03-06 LAB — WET PREP, GENITAL
SPERM: NONE SEEN
Trich, Wet Prep: NONE SEEN
YEAST WET PREP: NONE SEEN

## 2016-03-06 LAB — URINE MICROSCOPIC-ADD ON
Bacteria, UA: NONE SEEN
RBC / HPF: NONE SEEN RBC/hpf (ref 0–5)

## 2016-03-06 LAB — POC URINE PREG, ED: Preg Test, Ur: NEGATIVE

## 2016-03-06 MED ORDER — CEPHALEXIN 500 MG PO CAPS: 500.0000 mg | ORAL_CAPSULE | Freq: Four times a day (QID) | ORAL | 0 refills | Status: AC

## 2016-03-06 MED ORDER — NAPROXEN 250 MG PO TABS
500.0000 mg | ORAL_TABLET | Freq: Once | ORAL | Status: AC
  Administered 2016-03-06: 500 mg via ORAL
  Filled 2016-03-06: qty 2

## 2016-03-06 MED ORDER — METRONIDAZOLE 0.75 % VA GEL: VAGINAL | 0 refills | Status: DC

## 2016-03-06 MED ORDER — PREDNISONE 10 MG PO TABS: ORAL_TABLET | ORAL | 0 refills | Status: AC

## 2016-03-06 MED ORDER — NAPROXEN 500 MG PO TABS: 500.0000 mg | ORAL_TABLET | Freq: Two times a day (BID) | ORAL | 0 refills | Status: DC

## 2016-03-06 NOTE — ED Notes (Signed)
Patient given gown to change.  Patient states she wants to stay in her boyfriends room while he has sutures removed, encouraged patient to remain in her own room so provider can treat her. Patient eating chips and drinking soda.

## 2016-03-06 NOTE — ED Notes (Signed)
Family at bedside. 
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
## 2016-03-07 LAB — GC/CHLAMYDIA PROBE AMP (~~LOC~~) NOT AT ARMC
Chlamydia: POSITIVE — AB
Neisseria Gonorrhea: NEGATIVE

## 2016-03-08 ENCOUNTER — Telehealth (HOSPITAL_COMMUNITY): Payer: Self-pay

## 2016-03-08 NOTE — Telephone Encounter (Signed)
[facility].  (+) Chlamydia  No antibiotic treatment or prescription given for STD.  Chart to MD office for review.  DHHS form attached.

## 2016-03-09 ENCOUNTER — Telehealth (HOSPITAL_BASED_OUTPATIENT_CLINIC_OR_DEPARTMENT_OTHER): Payer: Self-pay

## 2016-03-19 ENCOUNTER — Telehealth (HOSPITAL_BASED_OUTPATIENT_CLINIC_OR_DEPARTMENT_OTHER): Payer: Self-pay | Admitting: Emergency Medicine

## 2016-03-19 NOTE — Telephone Encounter (Signed)
Letter returned, no forwarding address, LOST to followup 

## 2016-04-30 ENCOUNTER — Emergency Department (HOSPITAL_COMMUNITY)
Admission: EM | Admit: 2016-04-30 | Discharge: 2016-04-30 | Disposition: A | Discharge status: Home / Self Care | Payer: Self-pay | Attending: Emergency Medicine | Admitting: Emergency Medicine

## 2016-04-30 ENCOUNTER — Encounter (HOSPITAL_COMMUNITY): Payer: Self-pay | Admitting: *Deleted

## 2016-04-30 DIAGNOSIS — A5602 Chlamydial vulvovaginitis: Secondary | ICD-10-CM | POA: Insufficient documentation

## 2016-04-30 DIAGNOSIS — A749 Chlamydial infection, unspecified: Secondary | ICD-10-CM

## 2016-04-30 DIAGNOSIS — N760 Acute vaginitis: Secondary | ICD-10-CM | POA: Insufficient documentation

## 2016-04-30 DIAGNOSIS — F17200 Nicotine dependence, unspecified, uncomplicated: Secondary | ICD-10-CM | POA: Insufficient documentation

## 2016-04-30 DIAGNOSIS — Z91040 Latex allergy status: Secondary | ICD-10-CM | POA: Insufficient documentation

## 2016-04-30 DIAGNOSIS — B9689 Other specified bacterial agents as the cause of diseases classified elsewhere: Secondary | ICD-10-CM | POA: Insufficient documentation

## 2016-04-30 LAB — URINALYSIS, ROUTINE W REFLEX MICROSCOPIC
BILIRUBIN URINE: NEGATIVE
GLUCOSE, UA: NEGATIVE mg/dL
HGB URINE DIPSTICK: NEGATIVE
Ketones, ur: NEGATIVE mg/dL
Leukocytes, UA: NEGATIVE
Nitrite: NEGATIVE
PH: 7 (ref 5.0–8.0)
Protein, ur: NEGATIVE mg/dL
SPECIFIC GRAVITY, URINE: 1.021 (ref 1.005–1.030)

## 2016-04-30 LAB — POC URINE PREG, ED: PREG TEST UR: NEGATIVE

## 2016-04-30 MED ORDER — CEFTRIAXONE SODIUM 250 MG IJ SOLR
250.0000 mg | Freq: Once | INTRAMUSCULAR | Status: DC
  Filled 2016-04-30: qty 250

## 2016-04-30 MED ORDER — AZITHROMYCIN 250 MG PO TABS
1000.0000 mg | ORAL_TABLET | Freq: Once | ORAL | Status: AC
  Administered 2016-04-30: 1000 mg via ORAL
  Filled 2016-04-30: qty 4

## 2016-04-30 MED ORDER — STERILE WATER FOR INJECTION IJ SOLN
INTRAMUSCULAR | Status: AC
  Filled 2016-04-30: qty 10

## 2016-04-30 MED ORDER — AZITHROMYCIN 1 G PO PACK
1.0000 g | PACK | Freq: Once | ORAL | Status: DC
  Filled 2016-04-30: qty 1

## 2016-04-30 MED ORDER — METRONIDAZOLE 0.75 % VA GEL: VAGINAL | 0 refills | Status: DC

## 2016-04-30 NOTE — ED Notes (Signed)
ED Provider at bedside. 

## 2016-04-30 NOTE — Progress Notes (Signed)
ED CM received consult concerning medication assistance. Patient is uninsured.  CM discussed MATCH program and the guidelines, patient is agreeable to terms but states, she does not have the money to cover the co-pay. Patient reports that she is not working at this time and is couch surfing with friends and family at this time. Patient is enrolled in program, Letter printed with instructions and [facility] [phone]. Patient verbalized understanding teach back provided. Updated Dr. Rubin PayorPickering EDP on discharge plans.

## 2016-04-30 NOTE — ED Provider Notes (Signed)
[facility]-EM[facility]ENCY DEPT Provider Note   CSN: [id number] Arrival date & time: 04/30/16  1333     History   Chief Complaint Chief Complaint  Patient presents with  . Vaginal Discharge  . Exposure to STD    HPI Elizabeth Hoover is a 35 y.o. female.  HPI Patient presents with vaginal discharge. States she still has vaginal odor and discharge since last month. States she was seen in the ER and told she had no STD. She was given metronidazole gel prescription which she was not able to fill due to cost. She had culture come back positive for chlamydia. She was given likely a prescription for azithromycin as an outpatient. States she was not able to afford the metronidazole but did get the thousand milligrams pills filled. States she's continued to have symptoms and now had sex with the same man and the condom broke last week. States she thinks she may been exposed again. She is unwilling to get blood work done for HIV or syphilis. At this point she is refusing a repeat pelvic exam. States she just wants to be treated for all of it. No abdominal pain. Normal menses 2 weeks ago. No dysuria.   History reviewed. No pertinent past medical history.  There are no active problems to display for this patient.   Past Surgical History:  Procedure Laterality Date  . NECK SURGERY      OB History    No data available       Home Medications    Prior to Admission medications   Medication Sig Start Date End Date Taking? Authorizing Provider  cephALEXin (KEFLEX) 500 MG capsule Take 1 capsule (500 mg total) by mouth 4 (four) times daily. 03/06/16   Cheri FowlerKayla Rose, PA-C  metroNIDAZOLE (METROGEL) 0.75 % vaginal gel Apply one applicator daily or nightly for 5 days. 04/30/16   Benjiman CoreNathan Jamilia Jacques, MD  naproxen (NAPROSYN) 500 MG tablet Take 1 tablet (500 mg total) by mouth 2 (two) times daily. 03/06/16   Cheri FowlerKayla Rose, PA-C  predniSONE (DELTASONE) 10 MG tablet Day 1: Take 6 tablets PO at once Day 2: Take 5 tablets PO  at once Day 3: Take 4 tablets PO at once Day 4: Take 3 tablets PO at once Day 5: Take 2 tablets PO at once Day 6: Take 1 tablet PO at once 03/06/16   Cheri FowlerKayla Rose, PA-C    Family History No family history on file.  Social History Social History  Substance Use Topics  . Smoking status: Current Some Day Smoker  . Smokeless tobacco: Never Used  . Alcohol use Yes     Allergies   Latex and Metronidazole   Review of Systems Review of Systems  Constitutional: Negative for activity change and appetite change.  HENT: Negative for congestion.   Eyes: Negative for pain.  Respiratory: Negative for chest tightness and shortness of breath.   Cardiovascular: Negative for chest pain and leg swelling.  Gastrointestinal: Negative for abdominal pain and vomiting.  Genitourinary: Positive for vaginal discharge. Negative for flank pain, vaginal bleeding and vaginal pain.  Musculoskeletal: Negative for back pain.  Skin: Negative for rash.  Neurological: Negative for weakness, numbness and headaches.     Physical Exam Updated Vital Signs BP 129/96 (BP Location: Right Arm)   Pulse 68   Temp 97.9 F (36.6 C) (Oral)   Resp 16   LMP 04/16/2016   SpO2 97%   Physical Exam  Constitutional: She appears well-developed.  HENT:  Head: Normocephalic.  Eyes:  Pupils are equal, round, and reactive to light.  Cardiovascular: Normal rate.   Pulmonary/Chest: Effort normal.  Abdominal: Soft.  Musculoskeletal: She exhibits no edema.  Neurological: She is alert.  Skin: Skin is warm. Capillary refill takes less than 2 seconds.  Psychiatric: She has a normal mood and affect.     ED Treatments / Results  Labs (all labs ordered are listed, but only abnormal results are displayed) Labs Reviewed  URINALYSIS, ROUTINE W REFLEX MICROSCOPIC (NOT AT [facility])  POC URINE PREG, ED    EKG  EKG Interpretation None       Radiology No results found.  Procedures Procedures (including critical care  time)  Medications Ordered in ED Medications  cefTRIAXone (ROCEPHIN) injection 250 mg (250 mg Intramuscular Refused 04/30/16 2146)  sterile water (preservative free) injection (  Refused 04/30/16 2147)  azithromycin (ZITHROMAX) tablet 1,000 mg (1,000 mg Oral Given 04/30/16 2145)     Initial Impression / Assessment and Plan / ED Course  I have reviewed the triage vital signs and the nursing notes.  Pertinent labs & imaging results that were available during my care of the patient were reviewed by me and considered in my medical decision making (see chart for details).  Clinical Course     Patient with possible STD exposure. Seen 1 month ago and had BV and Chlamydia. Did apparently get azithromycin for the chlamydia. States last week there was a broken condom. She never got the metronidazole filled. Discussed with case management who will help with matching of the medicine. Patient refused pelvic exam. Refused injection of ceftriaxone. Refused the Zithromax powder states she only one of the pills. Discharge home to follow-up as an outpatient. Also refused blood draws for HIV and RPR.  Final Clinical Impressions(s) / ED Diagnoses   Final diagnoses:  Chlamydia  BV (bacterial vaginosis)    New Prescriptions Discharge Medication List as of 04/30/2016  8:49 PM       Benjiman CoreNathan Aya Geisel, MD 05/01/16 0040

## 2016-04-30 NOTE — ED Notes (Signed)
Pt cussing at EMT in waiting room when asked if I could recheck her vitals doesn't understand why she is still sitting here. Pt states she is here for an emergency and that it is not fair she has to wait.

## 2016-04-30 NOTE — ED Notes (Signed)
Patient requesting to speak with MD. MD made aware. 

## 2016-04-30 NOTE — ED Triage Notes (Signed)
Pt was told she had an STD last month and was treated for STD. Pt states she still has odor and discharge. Pt states the condom broke during intercourse last week and is concerned she may have been exposed to STD again.

## 2016-05-01 ENCOUNTER — Telehealth: Payer: Self-pay | Admitting: *Deleted

## 2016-08-19 ENCOUNTER — Emergency Department (HOSPITAL_COMMUNITY): Payer: Self-pay

## 2016-08-19 ENCOUNTER — Emergency Department (HOSPITAL_COMMUNITY)
Admission: EM | Admit: 2016-08-19 | Discharge: 2016-08-19 | Disposition: A | Discharge status: Home / Self Care | Payer: Self-pay | Attending: Emergency Medicine | Admitting: Emergency Medicine

## 2016-08-19 ENCOUNTER — Encounter (HOSPITAL_COMMUNITY): Payer: Self-pay | Admitting: Emergency Medicine

## 2016-08-19 DIAGNOSIS — F17200 Nicotine dependence, unspecified, uncomplicated: Secondary | ICD-10-CM | POA: Insufficient documentation

## 2016-08-19 DIAGNOSIS — X58XXXA Exposure to other specified factors, initial encounter: Secondary | ICD-10-CM | POA: Insufficient documentation

## 2016-08-19 DIAGNOSIS — Z91040 Latex allergy status: Secondary | ICD-10-CM | POA: Insufficient documentation

## 2016-08-19 DIAGNOSIS — S161XXA Strain of muscle, fascia and tendon at neck level, initial encounter: Secondary | ICD-10-CM | POA: Insufficient documentation

## 2016-08-19 DIAGNOSIS — Y939 Activity, unspecified: Secondary | ICD-10-CM | POA: Insufficient documentation

## 2016-08-19 DIAGNOSIS — Y999 Unspecified external cause status: Secondary | ICD-10-CM | POA: Insufficient documentation

## 2016-08-19 DIAGNOSIS — Y929 Unspecified place or not applicable: Secondary | ICD-10-CM | POA: Insufficient documentation

## 2016-08-19 DIAGNOSIS — Z79899 Other long term (current) drug therapy: Secondary | ICD-10-CM | POA: Insufficient documentation

## 2016-08-19 MED ORDER — NAPROXEN 500 MG PO TABS: 500.0000 mg | ORAL_TABLET | Freq: Two times a day (BID) | ORAL | 0 refills | Status: AC | PRN

## 2016-08-19 MED ORDER — METHOCARBAMOL 500 MG PO TABS
500.0000 mg | ORAL_TABLET | Freq: Once | ORAL | Status: AC
  Administered 2016-08-19: 500 mg via ORAL
  Filled 2016-08-19 (×2): qty 1

## 2016-08-19 MED ORDER — OXYCODONE-ACETAMINOPHEN 5-325 MG PO TABS
1.0000 | ORAL_TABLET | Freq: Once | ORAL | Status: AC
  Administered 2016-08-19: 1 via ORAL
  Filled 2016-08-19 (×2): qty 1

## 2016-08-19 MED ORDER — NAPROXEN 500 MG PO TABS: 500.0000 mg | ORAL_TABLET | Freq: Once | ORAL | Status: DC

## 2016-08-19 MED ORDER — METHOCARBAMOL 500 MG PO TABS
500.0000 mg | ORAL_TABLET | Freq: Two times a day (BID) | ORAL | 0 refills | Status: AC | PRN
Start: 2016-08-19 — End: ?

## 2016-08-19 NOTE — Discharge Instructions (Signed)
Naproxen as needed for pain. Robaxin as her muscle relaxer to take as needed for muscle tightness/spasms- This can make you very drowsy - please do not drink alcohol, operate heavy machinery or drive on this medication.   Please call the spine specialist listed to schedule a follow-up appointment. You can call them first thing tomorrow morning. Return to the ER for new or worsening symptoms, any additional concerns.

## 2016-08-19 NOTE — ED Notes (Signed)
Bed: WTR5 Expected date:  Expected time:  Means of arrival:  Comments: 
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
## 2016-08-24 ENCOUNTER — Emergency Department (HOSPITAL_COMMUNITY)
Admission: EM | Admit: 2016-08-24 | Discharge: 2016-08-24 | Disposition: A | Discharge status: Home / Self Care | Payer: Self-pay | Attending: Emergency Medicine | Admitting: Emergency Medicine

## 2016-08-24 ENCOUNTER — Encounter (HOSPITAL_COMMUNITY): Payer: Self-pay | Admitting: *Deleted

## 2016-08-24 DIAGNOSIS — Z79899 Other long term (current) drug therapy: Secondary | ICD-10-CM | POA: Insufficient documentation

## 2016-08-24 DIAGNOSIS — M542 Cervicalgia: Secondary | ICD-10-CM | POA: Insufficient documentation

## 2016-08-24 DIAGNOSIS — F17200 Nicotine dependence, unspecified, uncomplicated: Secondary | ICD-10-CM | POA: Insufficient documentation

## 2016-08-24 DIAGNOSIS — Z91040 Latex allergy status: Secondary | ICD-10-CM | POA: Insufficient documentation

## 2016-08-24 DIAGNOSIS — M62838 Other muscle spasm: Secondary | ICD-10-CM | POA: Insufficient documentation

## 2016-08-24 MED ORDER — HYDROCODONE-ACETAMINOPHEN 5-325 MG PO TABS: 1.0000 | ORAL_TABLET | Freq: Four times a day (QID) | ORAL | 0 refills | Status: AC | PRN

## 2016-08-24 MED ORDER — HYDROCODONE-ACETAMINOPHEN 5-325 MG PO TABS
1.0000 | ORAL_TABLET | Freq: Once | ORAL | Status: AC
  Administered 2016-08-24: 1 via ORAL
  Filled 2016-08-24: qty 1

## 2016-08-24 NOTE — ED Notes (Signed)
Brought pt warm packs, applied to neck, warm blanket wrapped around in scarf like fashion.  Pt given Mal[place]key sandwich, water and ginger ale also.

## 2016-08-24 NOTE — Discharge Instructions (Addendum)
As discussed, continue to use heat therapy and follow up at your appointment to the spine clinic on Tuesday. Use naproxen for pain and vicodin only for breakthrough pain.

## 2016-08-24 NOTE — ED Provider Notes (Signed)
[facility] Provider Note   CSN: [id number] Arrival date & time: 08/24/16  1103   By signing my name below, I, Soijett Blue, attest that this documentation has been prepared under the direction and in the presence of Mathews Robinsons, PA-C Electronically Signed: Soijett Blue, ED Scribe. 08/24/16. 1:34 PM.  History   Chief Complaint Chief Complaint  Patient presents with  . Neck Pain    HPI Elizabeth Hoover is a 36 y.o. female who presents to the Emergency Department complaining of right sided neck pain onset 1 week ago. Pt reports associated HA. Pt has tried Rx naprosyn, Rx robaxin, tramadol, ibuprofen, heat, and ice with minimal relief of her symptoms. Pt neck pain is worsened with movement to the right. She notes that she was seen in the ED 5 days ago for similar symptoms and referred to [place] spinal surgery, with an upcoming appointment on 08/28/2016. She reports that the pain has been the same, no new symptoms today. She states that she has had a past cervical spinal fusion surgery in [place] and was apart of a pain management clinic with chiropractor follow up. She reports that she was treated with hydrocodone and flexeril while in the pain management clinic. She denies neck stiffness, fever, chills, and any other symptoms.   Per pt chart review: Pt was seen in the ED on 08/19/2016 for neck pain. Pt had cervical spine xray imaging completed with no acute abnormalities. Pt was Rx naprosyn and robaxin for their symptoms. Pt was referred to neurosurgery for further evaluation.    The history is provided by the patient. No language interpreter was used.    History reviewed. No pertinent past medical history.  There are no active problems to display for this patient.   Past Surgical History:  Procedure Laterality Date  . NECK SURGERY      OB History    No data available       Home Medications    Prior to Admission medications   Medication Sig Start Date End Date  Taking? Authorizing Provider  cephALEXin (KEFLEX) 500 MG capsule Take 1 capsule (500 mg total) by mouth 4 (four) times daily. 03/06/16   Cheri Fowler, PA-C  HYDROcodone-acetaminophen (NORCO/VICODIN) 5-325 MG tablet Take 1 tablet by mouth every 6 (six) hours as needed for severe pain. 08/24/16   Georgiana Shore, PA-C  methocarbamol (ROBAXIN) 500 MG tablet Take 1 tablet (500 mg total) by mouth 2 (two) times daily as needed for muscle spasms. 08/19/16   Chase Picket Ward, PA-C  metroNIDAZOLE (METROGEL) 0.75 % vaginal gel Apply one applicator daily or nightly for 5 days. 04/30/16   Benjiman Core, MD  naproxen (NAPROSYN) 500 MG tablet Take 1 tablet (500 mg total) by mouth 2 (two) times daily as needed. 08/19/16   Chase Picket Ward, PA-C  predniSONE (DELTASONE) 10 MG tablet Day 1: Take 6 tablets PO at once Day 2: Take 5 tablets PO at once Day 3: Take 4 tablets PO at once Day 4: Take 3 tablets PO at once Day 5: Take 2 tablets PO at once Day 6: Take 1 tablet PO at once 03/06/16   Cheri Fowler, PA-C    Family History No family history on file.  Social History Social History  Substance Use Topics  . Smoking status: Current Some Day Smoker  . Smokeless tobacco: Never Used  . Alcohol use Yes     Allergies   Latex and Metronidazole   Review of Systems Review of Systems  Constitutional: Negative for chills and fever.  Eyes: Negative for photophobia and pain.  Respiratory: Negative for cough, choking, chest tightness, shortness of breath, wheezing and stridor.   Cardiovascular: Negative for chest pain, palpitations and leg swelling.  Gastrointestinal: Negative for nausea and vomiting.  Musculoskeletal: Positive for neck pain (right sided). Negative for gait problem and neck stiffness.  Skin: Negative for color change, pallor, rash and wound.  Neurological: Positive for headaches. Negative for dizziness, facial asymmetry, weakness, light-headedness and numbness.     Physical Exam Updated  Vital Signs BP (!) 148/108 (BP Location: Right Arm)   Pulse 98   Temp 98.5 F (36.9 C) (Oral)   Resp 18   LMP 07/26/2016   SpO2 98%   Physical Exam  Constitutional: She is oriented to person, place, and time. She appears well-developed and well-nourished. No distress.  Patient is afebrile, non-toxic appearing, seating comfortably in chair in no acute distress.  HENT:  Head: Normocephalic and atraumatic.  Eyes: EOM are normal.  Neck: Normal range of motion. Neck supple. No tracheal deviation present.  She experiences pain with rotation to the right, but has full rom.  Cardiovascular: Normal rate, regular rhythm, normal heart sounds and intact distal pulses.  Exam reveals no gallop and no friction rub.   No murmur heard. Pulmonary/Chest: Effort normal and breath sounds normal. No stridor. No respiratory distress. She has no wheezes. She has no rales. She exhibits no tenderness.  Musculoskeletal: Normal range of motion.       Cervical back: She exhibits tenderness and bony tenderness.  Midline cervical tenderness at baseline. Right trapezius muscle TTP.  Neurological: She is alert and oriented to person, place, and time. No sensory deficit. She exhibits normal muscle tone.  Skin: Skin is warm. No rash noted. She is not diaphoretic. No erythema. No pallor.  Psychiatric: She has a normal mood and affect. Her behavior is normal.  Nursing note and vitals reviewed.    ED Treatments / Results  DIAGNOSTIC STUDIES: Oxygen Saturation is 98% on RA, nl by my interpretation.    COORDINATION OF CARE: 1:34 PM Discussed treatment plan with pt at bedside which includes heating pad, norco, and pt agreed to plan.   Procedures Procedures (including critical care time)  Medications Ordered in ED Medications  HYDROcodone-acetaminophen (NORCO/VICODIN) 5-325 MG per tablet 1 tablet (1 tablet Oral Given 08/24/16 1401)     Initial Impression / Assessment and Plan / ED Course  I have reviewed the  triage vital signs and the nursing notes.   Patient presents with chronic neck pain that is not well controlled on current medications. She has baseline midline tenderness. No new symptoms. Neurovascularly intact.  Patient's pain was managed in the ED. Emphasized the importance of maintaining her spine clinic appointment and reestablish care in the area for chronic pain management. Patient was provided with resources.  On reassessment after application of heat, pt felt significant improvement of her symptoms and is ready to go home at this time.  Discharge home with close follow up with spine clinic and PCP.  Discussed strict return precautions and advised to return to the emergency department if experiencing any new or worsening symptoms. Instructions were understood and patient agreed with discharge plan.  Final Clinical Impressions(s) / ED Diagnoses   Final diagnoses:  Trapezius muscle spasm  Neck pain    New Prescriptions Discharge Medication List as of 08/24/2016  2:52 PM     I personally performed the services described in this documentation,  which was scribed in my presence. The recorded information has been reviewed and is accurate.     Georgiana ShoreJessica B Nyasha Rahilly, PA-C 08/24/16 1928    Tilden FossaElizabeth Rees, MD 08/27/16 1539

## 2016-08-24 NOTE — ED Triage Notes (Signed)
Pt complains of neck pain. Pt was seen for same on Sunday and given referral but was unable to get an appointment until next Tuesday.

## 2016-09-19 ENCOUNTER — Other Ambulatory Visit: Payer: Self-pay | Admitting: Neurosurgery

## 2016-09-21 ENCOUNTER — Other Ambulatory Visit: Payer: Self-pay | Admitting: Neurosurgery

## 2016-09-21 DIAGNOSIS — M5021 Other cervical disc displacement,  high cervical region: Secondary | ICD-10-CM

## 2016-09-21 DIAGNOSIS — S129XXA Fracture of neck, unspecified, initial encounter: Secondary | ICD-10-CM

## 2016-12-08 ENCOUNTER — Ambulatory Visit (HOSPITAL_COMMUNITY)
Admission: EM | Admit: 2016-12-08 | Discharge: 2016-12-08 | Disposition: A | Discharge status: Home / Self Care | Payer: Self-pay | Attending: Family Medicine | Admitting: Family Medicine

## 2016-12-08 DIAGNOSIS — Z3202 Encounter for pregnancy test, result negative: Secondary | ICD-10-CM | POA: Insufficient documentation

## 2016-12-08 DIAGNOSIS — N898 Other specified noninflammatory disorders of vagina: Secondary | ICD-10-CM

## 2016-12-08 DIAGNOSIS — Z711 Person with feared health complaint in whom no diagnosis is made: Secondary | ICD-10-CM

## 2016-12-08 DIAGNOSIS — Z113 Encounter for screening for infections with a predominantly sexual mode of transmission: Secondary | ICD-10-CM

## 2016-12-08 LAB — POCT URINALYSIS DIP (DEVICE)
Bilirubin Urine: NEGATIVE
GLUCOSE, UA: NEGATIVE mg/dL
Hgb urine dipstick: NEGATIVE
Ketones, ur: NEGATIVE mg/dL
Leukocytes, UA: NEGATIVE
NITRITE: NEGATIVE
PROTEIN: NEGATIVE mg/dL
SPECIFIC GRAVITY, URINE: 1.025 (ref 1.005–1.030)
UROBILINOGEN UA: 0.2 mg/dL (ref 0.0–1.0)
pH: 7 (ref 5.0–8.0)

## 2016-12-08 LAB — POCT PREGNANCY, URINE: Preg Test, Ur: NEGATIVE

## 2016-12-08 MED ORDER — METRONIDAZOLE 0.75 % VA GEL: VAGINAL | 0 refills | Status: AC

## 2016-12-08 NOTE — Discharge Instructions (Signed)
Use the Metro gel as directed for the full length of time. The swab test will be back and 24-48 hours. If there is anything else positive we will call you and most likely be able to treat this over the telephone. Please call the community health and wellness to set up for an appointment and establish with a PCP or you may use a telephone number listed in the back upper instructions to call to assist in obtaining a primary care provider. The health [facility] of [facility] also evaluates and manages concerns for possible STD.

## 2016-12-08 NOTE — ED Triage Notes (Signed)
Pt stated that she has had an odor and discharge for about a month, pt stated that it is BV because she gets it often

## 2016-12-08 NOTE — ED Provider Notes (Addendum)
CSN: [id number]     Arrival date & time 12/08/16  1523 History   None    Chief Complaint  Patient presents with  . Vaginal Discharge   (Consider location/radiation/quality/duration/timing/severity/associated sxs/prior Treatment) 36 year old female complaining of a vaginal discharge. She states she has BV. Denies pelvic pain. She has no PCP.      No past medical history on file. Past Surgical History:  Procedure Laterality Date  . NECK SURGERY     No family history on file. Social History  Substance Use Topics  . Smoking status: Current Some Day Smoker  . Smokeless tobacco: Never Used  . Alcohol use Yes   OB History    No data available     Review of Systems  Constitutional: Negative.   Genitourinary: Positive for vaginal discharge. Negative for dysuria, frequency, hematuria and pelvic pain.  Neurological: Negative.   All other systems reviewed and are negative.   Allergies  Latex and Metronidazole  Home Medications   Prior to Admission medications   Medication Sig Start Date End Date Taking? Authorizing Provider  cephALEXin (KEFLEX) 500 MG capsule Take 1 capsule (500 mg total) by mouth 4 (four) times daily. 03/06/16   Cheri Fowler, PA-C  HYDROcodone-acetaminophen (NORCO/VICODIN) 5-325 MG tablet Take 1 tablet by mouth every 6 (six) hours as needed for severe pain. 08/24/16   Georgiana Shore, PA-C  methocarbamol (ROBAXIN) 500 MG tablet Take 1 tablet (500 mg total) by mouth 2 (two) times daily as needed for muscle spasms. 08/19/16   Ward, Chase Picket, PA-C  metroNIDAZOLE (METROGEL) 0.75 % vaginal gel 1 applicator full pv bid x 5 days 12/08/16   Hayden Rasmussen, NP  naproxen (NAPROSYN) 500 MG tablet Take 1 tablet (500 mg total) by mouth 2 (two) times daily as needed. 08/19/16   Ward, Chase Picket, PA-C  predniSONE (DELTASONE) 10 MG tablet Day 1: Take 6 tablets PO at once Day 2: Take 5 tablets PO at once Day 3: Take 4 tablets PO at once Day 4: Take 3 tablets PO at once Day  5: Take 2 tablets PO at once Day 6: Take 1 tablet PO at once 03/06/16   Cheri Fowler, PA-C   Meds Ordered and Administered this Visit  Medications - No data to display  BP 140/86 (BP Location: Left Arm)   Pulse 65   Temp 98.6 F (37 C) (Oral)   SpO2 96%  No data found.   Physical Exam  Constitutional: She is oriented to person, place, and time. She appears well-developed and well-nourished. No distress.  Neck: Normal range of motion. Neck supple.  Cardiovascular: Normal rate.   Pulmonary/Chest: Effort normal. No respiratory distress.  Abdominal: Soft. She exhibits no distension. There is no tenderness.  Genitourinary:  Genitourinary Comments: Patient self inserted the speculum but had to be adjusted by the provider to locate the cervix which was posterior. There is a small amount of gray than discharge. The actual os was not viewable due to its position. No evidence of bleeding or other lesions. No CMT.  Musculoskeletal: Normal range of motion. She exhibits no edema.  Neurological: She is alert and oriented to person, place, and time. No cranial nerve deficit. She exhibits normal muscle tone.  Skin: Skin is warm and dry.  Psychiatric: She has a normal mood and affect.  Nursing note and vitals reviewed.   Urgent Care Course     Procedures (including critical care time)  Labs Review Labs Reviewed  POCT PREGNANCY, URINE  POCT  URINALYSIS DIP (DEVICE)  CERVICOVAGINAL ANCILLARY ONLY   Results for orders placed or performed during the hospital encounter of 12/08/16  Pregnancy, urine POC  Result Value Ref Range   Preg Test, Ur NEGATIVE NEGATIVE  POCT urinalysis dip (device)  Result Value Ref Range   Glucose, UA NEGATIVE NEGATIVE mg/dL   Bilirubin Urine NEGATIVE NEGATIVE   Ketones, ur NEGATIVE NEGATIVE mg/dL   Specific Gravity, Urine 1.025 1.005 - 1.030   Hgb urine dipstick NEGATIVE NEGATIVE   pH 7.0 5.0 - 8.0   Protein, ur NEGATIVE NEGATIVE mg/dL   Urobilinogen, UA 0.2 0.0  - 1.0 mg/dL   Nitrite NEGATIVE NEGATIVE   Leukocytes, UA NEGATIVE NEGATIVE     Imaging Review No results found.   Visual Acuity Review  Right Eye Distance:   Left Eye Distance:   Bilateral Distance:    Right Eye Near:   Left Eye Near:    Bilateral Near:         MDM   1. Vaginal discharge   2. Concern about STD in female without diagnosis    Use the Metro gel as directed for the full length of time. The swab test will be back and 24-48 hours. If there is anything else positive we will call you and most likely be able to treat this over the telephone. Please call the community health and wellness to set up for an appointment and establish with a PCP or you may use a telephone number listed in the back upper instructions to call to assist in obtaining a primary care provider. The [facility] [place] [facility] [place] also evaluates and manages concerns for possible STD. Meds ordered this encounter  Medications  . metroNIDAZOLE (METROGEL) 0.75 % vaginal gel    Sig: 1 applicator full pv bid x 5 days    Dispense:  70 g    Refill:  0    Order Specific Question:   Supervising Provider    Answer:   Mardella LaymanHAGLER, BRIAN [[id number]]       MWNU, UVOZD[[id number]]       Ciela Mahajan, NP 12/08/16 Doristine [id number]    Syanna Remmert, NP 12/08/16 1759    Hayden RasmussenMabe, Davianna Deutschman, NP 12/08/16 2118

## 2016-12-10 LAB — CERVICOVAGINAL ANCILLARY ONLY
Bacterial vaginitis: NEGATIVE
Candida vaginitis: NEGATIVE
Chlamydia: NEGATIVE
Neisseria Gonorrhea: NEGATIVE
TRICH (WINDOWPATH): NEGATIVE

## 2016-12-30 ENCOUNTER — Ambulatory Visit (HOSPITAL_COMMUNITY)
Admission: EM | Admit: 2016-12-30 | Discharge: 2016-12-30 | Disposition: A | Discharge status: Home / Self Care | Payer: Self-pay | Attending: Internal Medicine | Admitting: Internal Medicine

## 2016-12-30 ENCOUNTER — Encounter (HOSPITAL_COMMUNITY): Payer: Self-pay | Admitting: *Deleted

## 2016-12-30 DIAGNOSIS — W57XXXA Bitten or stung by nonvenomous insect and other nonvenomous arthropods, initial encounter: Secondary | ICD-10-CM

## 2016-12-30 DIAGNOSIS — T07XXXA Unspecified multiple injuries, initial encounter: Secondary | ICD-10-CM

## 2016-12-30 MED ORDER — FLUCONAZOLE 150 MG PO TABS: ORAL_TABLET | ORAL | 0 refills | Status: AC

## 2016-12-30 MED ORDER — PERMETHRIN 5 % EX CREA: TOPICAL_CREAM | CUTANEOUS | 2 refills | Status: AC

## 2016-12-30 MED ORDER — SULFAMETHOXAZOLE-TRIMETHOPRIM 800-160 MG PO TABS: 1.0000 | ORAL_TABLET | Freq: Two times a day (BID) | ORAL | 0 refills | Status: AC

## 2016-12-30 NOTE — ED Triage Notes (Signed)
Reports staying in hotel over past couple days; noticed a bed bug in hotel 2 days ago.  Started with painful blistery lesions to various areas of body; yesterday more lesions noticed.  Had fever up to 102; left cheek lesion has been draining.

## 2017-01-06 ENCOUNTER — Encounter (HOSPITAL_COMMUNITY): Payer: Self-pay | Admitting: Family Medicine

## 2017-01-06 ENCOUNTER — Ambulatory Visit (HOSPITAL_COMMUNITY)
Admission: EM | Admit: 2017-01-06 | Discharge: 2017-01-06 | Disposition: A | Discharge status: Home / Self Care | Payer: Self-pay

## 2017-01-06 DIAGNOSIS — R21 Rash and other nonspecific skin eruption: Secondary | ICD-10-CM

## 2017-01-06 DIAGNOSIS — W57XXXA Bitten or stung by nonvenomous insect and other nonvenomous arthropods, initial encounter: Secondary | ICD-10-CM

## 2017-01-06 NOTE — ED Triage Notes (Signed)
Pt here for work note to return to work 

## 2017-01-06 NOTE — ED Provider Notes (Signed)
  [facility]   [id number] 01/06/17 Arrival Time: 1929  ASSESSMENT & PLAN:  1. Bedbug bite, initial encounter     No orders of the defined types were placed in this encounter.   Reviewed expectations re: course of current medical issues. Questions answered. Outlined signs and symptoms indicating need for more acute intervention. Patient verbalized understanding. After Visit Summary given.   SUBJECTIVE:  Elizabeth Hoover is a 36 y.o. female who presents with complaint of being bitten by bed bugs a week ago and she has taken all her meds and rash has resolved and she needs a note to go back to work stating she is not communicable.  ROS: As per HPI.   OBJECTIVE:  Vitals:   01/06/17 2014  BP: 116/88  Pulse: 82  Resp: 18  Temp: 98 F (36.7 C)  SpO2: 96%     General appearance: alert; no distress HEENT: normocephalic; atraumatic; conjunctivae normal; TMs normal; nasal mucosa normal; oral mucosa normal Neck: supple Lungs: clear to auscultation bilaterally Heart: regular rate and rhythm Abdomen: soft, non-tender; bowel sounds normal; no masses or organomegaly; no guarding or rebound tenderness Back: no CVA tenderness Extremities: no cyanosis or edema; symmetrical with no gross deformities Skin: warm and dry Neurologic: normal symmetric reflexes; normal gait Psychological:  alert and cooperative; normal mood and affect    Labs Reviewed - No data to display  No results found.  Allergies  Allergen Reactions  . Latex Hives  . Metronidazole Nausea And Vomiting    PMHx, SurgHx, SocialHx, Medications, and Allergies were reviewed in the Visit Navigator and updated as appropriate.      Deatra CanterOxford, Latoi Giraldo J, OregonFNP 01/06/17 2021

## 2017-01-07 MED FILL — VANDAZOLE VAGINAL 0.75% GEL: 0.75 | 5 days supply | Qty: 70 | Fill #0
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
## 2022-11-16 ENCOUNTER — Ambulatory Visit: Payer: Medicaid Other

## 2022-11-16 VITALS — BP 121/83 | HR 65 | Temp 97.7°F | Resp 16

## 2022-11-16 DIAGNOSIS — H6121 Impacted cerumen, right ear: Secondary | ICD-10-CM | POA: Insufficient documentation

## 2022-11-16 DIAGNOSIS — N760 Acute vaginitis: Secondary | ICD-10-CM | POA: Insufficient documentation

## 2022-11-16 DIAGNOSIS — H6691 Otitis media, unspecified, right ear: Secondary | ICD-10-CM | POA: Insufficient documentation

## 2022-11-16 DIAGNOSIS — H6690 Otitis media, unspecified, unspecified ear: Secondary | ICD-10-CM | POA: Insufficient documentation

## 2022-11-16 DIAGNOSIS — H6120 Impacted cerumen, unspecified ear: Secondary | ICD-10-CM | POA: Insufficient documentation

## 2022-11-16 DIAGNOSIS — Z758 Other problems related to medical facilities and other health care: Secondary | ICD-10-CM | POA: Insufficient documentation

## 2022-11-16 DIAGNOSIS — L209 Atopic dermatitis, unspecified: Secondary | ICD-10-CM | POA: Insufficient documentation

## 2022-11-16 LAB — VAGINITIS SCREEN: DNA PROBE: Vaginitis Screen:DNA Probe: POSITIVE — AB

## 2022-11-16 MED ORDER — FLUCONAZOLE 150 MG PO TABS *I*
150.0000 mg | ORAL_TABLET | Freq: Once | ORAL | 0 refills | Status: AC
Start: 2022-11-16 — End: 2022-11-16

## 2022-11-16 MED ORDER — AMOXICILLIN-POT CLAVULANATE 875-125 MG PO TABS *I*
1.0000 | ORAL_TABLET | Freq: Two times a day (BID) | ORAL | 0 refills | Status: AC
Start: 2022-11-16 — End: 2022-11-23

## 2022-11-16 NOTE — Patient Instructions (Addendum)
PLEASE REVIEW ALL INSTRUCTIONS FOR DETAILS AND CONTENT CONTAINED IN THE DISCHARGE MATERIALS NOT COVERED AT DISCHARGE.    Thank you Cheryl Rush for coming to [facility] for your health care concerns.    You were seen today for ear pain which we believe to be acute otitis media (middle ear infection).    You were alos seen for vaginal symptoms. Vaginitis screen sent which tests for yeast, bacterial vaginosis (BV) and trichomonas. Results will return in 1-2 days and we will notify you with any positive results. In the meantime, we will treat you for a vaginal yeast infection.    STI testing declined  sent for gonorrhea, chlamydia, trichomonas, HIV, Syphilis and Hepatitis B and C.    Left ear irrigation/lavage with warm water was completed on the patient for cerumen impaction by the nurse. The procedure was not supervised by me. Results of procedure were cerumen removed following my assessment. Patient Patient tolerated procedure well. Reassessment of ear shows healthy tympanic membrane.       Please follow plan as discussed including:        MEDICATIONS:  We have prescribed you a medication called Augmentin today. Please finish entire 7-day course as prescribed.  It is a good idea to take a probiotic or eat yogurt while on antibiotics to avoid GI upset.   We have prescribed you a medication called Diflucan today for presumed yeast infection. Please take as prescribed.  Begin taking over-the-counter allergy medication such as Zyrtec or Claritin for allergy symptoms.   You can continue using over-the-counter Clear eyes eye drops for eye symptoms. You may also want to try over-the-counter eye allergy eye drops including Pataday or Alaway.  Avoid touching tip of bottle to the eyelashes.  For pain/inflammation relief, you can take either ibuprofen or Tylenol.  ibuprofen 400-600 mg every 6-8 hours as needed. Please take with food.  Tylenol 650-1000mg  every 8 hours is also recommended as needed.   Do not exceed 3000mg   in a 24hr period.  You may alternate these medication for better pain control.  *Do not take these medications if you were advised not to by a health care provider.        ADDITIONAL INSTRUCTIONS:  Ear symptoms:  Rest  Hydration  Please do not stick anything into your ear, including Q-tips.   You may try warm compresses (such as a wet washcloth warmed in the microwave, or run under warm water) to the affected ear to help with pain/discomfort for ten minutes at a time.      Eye symptoms:  Apply cool compresses to the eye for itching/pain.  Throw away current eye makeup and replace    Vaginal symptoms:  Wear all cotton underwear only (entire panty, not just the crotch)  Do not wear thongs  Remove wet swimsuits and exercise clothing promptly  Avoid soaking in bubble baths frequently or using hot tubs  Wash your underwear separately with dye-free, scent-free detergent  Do not use dryer sheets or fabric softener on underwear  Do not douche  Use mild soap (externally only): Some examples are non-scented Dove (bar soap), Aveeno, Cetaphil and Cerave  Avoid underwear at bedtime    Dry Skin on hands:  You can try Eucerin, Aquaphor or Vaseline to the dry skin.   Avoid itching skin.    Cyst on buttock:   Apply warm compresses 4x daily to the skin for 10-15 minutes at a time to help with drainage.  Monitor for infection  FOLLOW-UP:  Please follow up with primary care to establish care with a new doctor. An electronic referral has been sent for you. They should call you within 2-3 days to set up an appointment that is suitable for you. If they did not call you within 5 days, please call them to set up an appointment with the number provided in your discharge paperwork.      Please monitor for worsening ear pain, drainage from ears/eyes, swelling, hearing loss, neck pain, redness of skin, fever, chills, shortness of breath/trouble breathing, chest pain, redness of skin, pus-like drainage, red streaking, warmth, worsening pain,  vision changes, photophobia, worsening eye drainage and pain. If you have any concerns or worsening symptoms please call your doctor immediately or return for further evaluation/call 911.    In the event of an emergency please dial 911.

## 2022-11-16 NOTE — [facility] Provider Note (Signed)
History     Chief Complaint   Patient presents with   . Vaginitis     Patient reports vaginal itching for the past 3 months. No concerns for STIs    Concerns for abscess between buttocks since last summer.  States it went away ans came back I December    States R ear has foul smelling clear drainage that began 2 days ago. States it comes and goes. No fevers     Cheryl Rush is a 42 y.o. female who presents with vaginal itching x3 months and white "cottage cheese" vaginal discharge x2 weeks. Denies vaginal bleeding, vaginal pain, genital lesions, dysuria, nausea/vomiting, abdominal/flank pain, fevers, chills, body aches. She has tried [employer] for vaginal symptoms with no effect. Patent reports no sexual activity in 7 months. Patient reports one partner and they are not monogamous. They use condoms and she has tested negative for STIs since the last time she had sex. She is not concerned for pregnancy or STIs at this time and declines testing for sexually transmitted infections.     Patient reports intermittent right ear drainage x1 year. She reports yellow and sometimes clear drainage from the ear that comes and goes. Denies ear pain, drainage from the right ear, decreased hearing. Patient reports she has seasonal allergies but does not take any medications to treat them.    Patient reports bilateral eye redness, irritation, and drainage  x 3 months.  Patient reports she wakes up every morning and both eyes appear blood shot. She reports crusty green/yellow drainage in the corner of bilateral eyes upon waking up in the morning. She reports the eyes are itchy at times. She has been using Clear eyes which helps with the redness and drainage.  Denies eye pain, foreign body sensation, vision changes, photophobia. She does not wear contacts.     Patient reports painful abscess on buttock off and on x2 years. The abscess occasionally opened and drained fluids. She is not currently having symptoms. Her previous PCP  referred her to a Development worker, international aid when she lived in [place] but she was not able to see them before she moved here. She reports she is moving again in July.       History provided by:  Patient  Language interpreter used: No        Medical/Surgical/Family History     Past Medical History:   Diagnosis Date   . Cervical spondylosis without myelopathy 06/26/2013        Patient Active Problem List   Diagnosis Code   . Pain in joint, lower leg M25.569   . Contusion of knee S80.00XA   . Patellar pain M25.569   . Cervical spondylosis without myelopathy M47.812   . Tobacco use disorder F17.200   . 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 Cervicalgia M54.2            Past Surgical History:   Procedure Laterality Date   . ACDF 45 56 67  07/02/13     No family history on file.       Social History     Tobacco Use   . Smoking status: Every Day     Packs/day: .05     Types: Cigarettes   . Smokeless tobacco: Never   Substance Use Topics   . Alcohol use: Yes     Comment: beer daily   . Drug use: Yes  Types: Marijuana     Living Situation       Questions Responses    Patient lives with     Homeless     Caregiver for other family member     External Services     Employment     Domestic Violence Risk                   Review of Systems   Review of Systems   Constitutional:  Negative for chills and fever.   HENT:  Negative for congestion, ear pain, sinus pressure, sinus pain and sore throat.    Eyes:  Positive for discharge, redness and itching. Negative for photophobia, pain and visual disturbance.   Respiratory:  Negative for cough.    Skin:  Negative for color change and wound.   Psychiatric/Behavioral:  Negative for agitation, behavioral problems and confusion.        Physical Exam   Vitals     First Recorded BP: 121/83, Resp: 16, Temp: 36.5 C (97.7 F), Temp src: TEMPORAL Oxygen Therapy SpO2: 97 %, Heart Rate: 65, (11/16/22 0954)   .      Physical Exam  Vitals and nursing note reviewed.   Constitutional:       Appearance: Normal appearance. She is normal weight.   HENT:      Head: Normocephalic.      Right Ear: Tympanic membrane, ear canal and external ear normal. Drainage and tenderness (inner ear tenderness) present. No swelling. There is no impacted cerumen.      Left Ear: Tympanic membrane, ear canal and external ear normal. No drainage, swelling or tenderness. There is impacted cerumen.      Ears:      Comments: Unable to visualize the right tympanic membrane due to copious purulent drainage vs loose cerumen in right ear canal. Unable to visualize left tympanic membrane due to cerumen impaction.     Nose: Nose normal. No congestion or rhinorrhea.      Mouth/Throat:      Mouth: Mucous membranes are moist.      Pharynx: No oropharyngeal exudate or posterior oropharyngeal erythema.   Eyes:      General: Lids are normal. Gaze aligned appropriately.         Right eye: No foreign body, discharge or hordeolum.         Left eye: No foreign body, discharge or hordeolum.      Extraocular Movements: Extraocular movements intact.      Right eye: No nystagmus.      Left eye: No nystagmus.      Conjunctiva/sclera: Conjunctivae normal.      Right eye: Right conjunctiva is not injected. No chemosis, exudate or hemorrhage.     Left eye: Left conjunctiva is not injected. No chemosis, exudate or hemorrhage.     Pupils: Pupils are equal, round, and reactive to light.      Right eye: Pupil is not sluggish.      Left eye: Pupil is not sluggish.      Funduscopic exam:     Right eye: Red reflex present.         Left eye: Red reflex present.     Slit lamp exam:     Right eye: No photophobia.      Left eye: No photophobia.   Cardiovascular:      Rate and Rhythm: Normal rate and regular rhythm.      Heart sounds: Normal heart sounds. No murmur  heard.  Pulmonary:      Effort: Pulmonary effort is normal. No respiratory distress.      Breath sounds: Normal breath  sounds. No wheezing, rhonchi or rales.   Abdominal:      General: Abdomen is flat. Bowel sounds are normal. There is no distension.      Palpations: Abdomen is soft.      Tenderness: There is no abdominal tenderness. There is no right CVA tenderness, left CVA tenderness or guarding.   Genitourinary:     Comments: Patient refused pelvic exam  Skin:     General: Skin is warm and dry.      Capillary Refill: Capillary refill takes less than 2 seconds.      Findings: No abscess, ecchymosis, erythema, lesion, rash or wound.      Comments: Thickened tissue/scarring of gluteal cleft that looks like a pilonidal cyst. No erythema, swelling, induration, fluctuance, drainage, warmth, and red streaking.    Neurological:      General: No focal deficit present.      Mental Status: She is alert and oriented to person, place, and time.   Psychiatric:         Mood and Affect: Mood normal.         Behavior: Behavior normal.         Thought Content: Thought content normal.         Judgment: Judgment normal.        Medical Decision Making   Medical Decision Making  Assessment:    Cheryl Rush is a 42 y.o. female who presents with vaginal itching x3 months and white "cottage cheese" vaginal discharge x2 weeks. Patient declined pelvic exam. Patient reports intermittent right ear pain and drainage x1 year. No external ear pain. Unable to visualize the right tympanic membrane due to copious purulent drainage vs loose cerumen in right ear canal. Unable to visualize left tympanic membrane due to cerumen impaction.  Patient reports bilateral eye redness, irritation, and drainage  x3 months.  No erythema of sclera, injected conjunctiva, and drainage of bilateral eyes. Patient reports painful abscess on buttock off and on x2 years. Thickened tissue/scarring of gluteal cleft that looks like a pilonidal cyst. No erythema, swelling, induration, fluctuance, drainage, warmth, and red streaking. Vital signs reviewed and are normal. Patient is alert  and oriented during exam.  Respiratory effort is easy, lungs are clear to auscultations. Regular rate and rhythm on cardiac exam.    Differential diagnosis:    Vulvovaginitis Candidiasis  Bacterial Vaginosis  Sexually Transmitted Infection   Acute otitis media  Dry eye  Pilonidal cyst   Atrophic Vaginitis- not consistent with exam  Acute otitis externa- not consistent with exam  Serous otitis media- not consistent with exam  Acute Bacterial Conjunctivitis- not consistent with exam  Acute Viral Conjunctivitis- not consistent with exam  Acute Allergic Conjunctivitis- not consistent with exam  Foreign Body- not consistent with exam  Corneal Abrasion- not consistent with exam  Corneal Ulcer- not consistent with exam  Hordeolum/chalazion- not consistent with exam  Abscess- not consistent with exam  Cellulitis- not consistent with exam  Sebaceous cyst- not consistent with exam    Plan and Results:    Vaginitis swab pending for vulvovaginal candidiasis and bacterial vaginosis  Patient has declined testing for sexually transmitted infections at this time.  Patient has declined irrigation of left ear with cerumen impaction    Augmentin x7 days   Diflucan x 1 dose  Cool compresses for eyes  Alaway or Pataday allergy eyedrops  Begin allergy medication daily  Warm compresses to cyst on buttock when experiencing symptoms  Tylenol and ibuprofen for pain    At the conclusion of our urgent care visit, patient reports dry itchy skin on the right hand and fingers.  This appears to be eczema and I have recommended Eucerin cream, Aquaphor or Vaseline.       Reviewed diagnosis of (H66.90) Acute otitis media  (primary encounter diagnosis)  (Z75.8) Does not have primary care provider  (H61.20) Impacted cerumen, unspecified laterality  (L20.9) Atopic dermatitis, unspecified type  (N76.0) Vaginitis    Patient Instructions  PLEASE REVIEW ALL INSTRUCTIONS FOR DETAILS AND CONTENT CONTAINED IN THE   DISCHARGE MATERIALS NOT COVERED AT  DISCHARGE.    Thank you Cheryl Rush for coming to [facility] for your health care   concerns.    You were seen today for ear pain which we believe to be acute otitis media   (middle ear infection).    You were alos seen for vaginal symptoms. Vaginitis screen sent which tests   for yeast, bacterial vaginosis (BV) and trichomonas. Results will return   in 1-2 days and we will notify you with any positive results. In the   meantime, we will treat you for a vaginal yeast infection.    STI testing declined  sent for gonorrhea, chlamydia, trichomonas, HIV,   Syphilis and Hepatitis B and C.    Left ear irrigation/lavage with warm water was completed on the patient   for cerumen impaction by the nurse. The procedure was not supervised by   me. Results of procedure were cerumen removed following my assessment.   Patient Patient tolerated procedure well. Reassessment of ear shows   healthy tympanic membrane.       Please follow plan as discussed including:        MEDICATIONS:  We have prescribed you a medication called Augmentin today. Please finish   entire 7-day course as prescribed.  It is a good idea to take a probiotic or eat yogurt while on antibiotics   to avoid GI upset.   We have prescribed you a medication called Diflucan today for presumed   yeast infection. Please take as prescribed.  Begin taking over-the-counter allergy medication such as Zyrtec or   Claritin for allergy symptoms.   You can continue using over-the-counter Clear eyes eye drops for eye   symptoms. You may also want to try over-the-counter eye allergy eye drops   including Pataday or Alaway.  Avoid touching tip of bottle to the eyelashes.  For pain/inflammation relief, you can take either ibuprofen or Tylenol.  ibuprofen 400-600 mg every 6-8 hours as needed. Please take with food.  Tylenol 650-1000mg  every 8 hours is also recommended as needed.   Do not exceed 3000mg  in a 24hr period.  You may alternate these medication for better pain  control.  *Do not take these medications if you were advised not to by a health care   provider.        ADDITIONAL INSTRUCTIONS:  Ear symptoms:  Rest  Hydration  Please do not stick anything into your ear, including Q-tips.   You may try warm compresses (such as a wet washcloth warmed in the   microwave, or run under warm water) to the affected ear to help with   pain/discomfort for ten minutes at a time.      Eye symptoms:  Apply cool compresses to the eye for  itching/pain.  Throw away current eye makeup and replace    Vaginal symptoms:  Wear all cotton underwear only (entire panty, not just the crotch)  Do not wear thongs  Remove wet swimsuits and exercise clothing promptly  Avoid soaking in bubble baths frequently or using hot tubs  Wash your underwear separately with dye-free, scent-free detergent  Do not use dryer sheets or fabric softener on underwear  Do not douche  Use mild soap (externally only): Some examples are non-scented Dove (bar   soap), Aveeno, Cetaphil and Cerave  Avoid underwear at bedtime    Dry Skin on hands:  You can try Eucerin, Aquaphor or Vaseline to the dry skin.   Avoid itching skin.    Cyst on buttock:   Apply warm compresses 4x daily to the skin for 10-15 minutes at a time to   help with drainage.  Monitor for infection    FOLLOW-UP:  Please follow up with primary care to establish care with a new doctor. An   electronic referral has been sent for you. They should call you within 2-3   days to set up an appointment that is suitable for you. If they did not   call you within 5 days, please call them to set up an appointment with the   number provided in your discharge paperwork.      Please monitor for worsening ear pain, drainage from ears/eyes, swelling,   hearing loss, neck pain, redness of skin, fever, chills, shortness of   breath/trouble breathing, chest pain, redness of skin, pus-like drainage,   red streaking, warmth, worsening pain, vision changes, photophobia,   worsening eye  drainage and pain. If you have any concerns or worsening   symptoms please call your doctor immediately or return for further   evaluation/call 911.    In the event of an emergency please dial 911.  Encounter orders    Orders Placed This Encounter      Vaginitis screen: DNA probe      ED/UC REFERRAL TO PRIMARY CARE      Ear irrigation      amoxicillin-clavulanate (AUGMENTIN) 875-125 mg tablet      fluconazole (DIFLUCAN) 150 mg tablet    Lab results   No results found for this or any previous visit (from the past 24 hour(s)).    Independent Review of: chart/prior records    Diagnosis and Disposition:   Reviewed supportive measures and return precautions listed in discharge paperwork.  Patient verbalized understanding of all and state they are comfortable with discharge plan at this time.    Return precautions discussed and provided on AVS.Follow-up  Follow up in about 1 week (around 11/23/2022) for PCP Evaluation.            Final Diagnosis    ICD-10-CM ICD-9-CM   1. Acute otitis media  H66.90 382.9   2. Does not have primary care provider  Z75.8 V49.89   3. Impacted cerumen, unspecified laterality  H61.20 380.4   4. Atopic dermatitis, unspecified type  L20.9 691.8   5. Vaginitis  N76.0 616.10         Signed by:  Brien Few, NP  [facility]  Department of Emergency Medicine  11/18/2022 9:25 PM            Author:  Brien Few, NP

## 2023-03-21 ENCOUNTER — Telehealth: Payer: Self-pay

## 2023-03-21 NOTE — Telephone Encounter (Signed)
Copied from CRM [id number]. Topic: Access to Care - Refer to Specialty or Service  >> Mar 21, 2023  3:12 PM Mick Sell T wrote:  Pt called interested in methadone clinic.

## 2023-03-21 NOTE — Telephone Encounter (Signed)
Writer reached out to [place] and received the message "the wireless caller you are trying is not available. Please try again later". Writer was unable to leave a message.

## 2023-04-03 ENCOUNTER — Telehealth: Payer: Self-pay

## 2023-04-03 NOTE — Telephone Encounter (Signed)
Copied from CRM [id number]. Topic: Return Call - Speak to Provider/Office Staff  >> Apr 03, 2023  3:49 PM Nena Jordan H wrote:  .pt called regarding getting an appointment with [facility] contact for caller is [phone]

## 2023-04-04 ENCOUNTER — Telehealth: Payer: Self-pay

## 2023-04-04 NOTE — Telephone Encounter (Signed)
[facility] Telephone Intake Screen     Patient Information:    Patient's Name: Cheryl Rush  Patient's Date of Birth: [birth year]  Patient's Medical Record Number: [id number]  Patient's Home Phone: [phone] (home)  Patient's Work Phone: [phone] (work)  [employer]:   Telephone Information:   Mobile [phone]     Patient's preferred number: [phone]  Patient's Marital Status (if applicable): Single  Can a message be left? yes    Was the session conducted in the patient's preferred language? Yes    If yes, what is the preferred language? English  If the patient is bilingual, did the patient prefer to conduct the session in English and   demonstrate understanding? Yes    Insurance Information:    [place] medicaid    Policy number: [id number]       Referral Source:  Referral Source: Self-Referral      Presenting Concern:     What is the reason you are seeking care?     (Please provide a brief description of the reasons the patient or referring provider is seeking mental health treatment at this time.  Please use patients own words where possible).    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 She states that "it was effecting my teeth and I didn't like what it was doing to me". She reports experiencing issues with the pharmacy as well. Sauda is currently connected to [facility] for chemical dependency and mental health. She denies use of opioids in almost a year, however she does report experiencing cravings. She is also experiencing pain and is "worried I will go back to the pain pills if I do not try something soon". When she was using opioids, she reports using 6 mg of percocet pills daily. She denies intravenous use. Kasie is wanting to remain in outpatient program at [facility]. Her counselor is aware, noting she discussed with Tawney that she would have to come to [facility] daily for medication. She expressed this to  this Clinical research associate. She reports smoking cannabis daily and denies use of any other substances, including alcohol or benzodiazepines.     Writer informed pt that [facility] has a wait list for the methadone program. Writer informed pt that the clinic would be reaching out to them every 2 weeks to check in with the pt. Writer stated that they may be inquiring if the pt is still interested in being on the wait list for the methadone program. Writer stated that this call could be to inform them that they are at the top of the waiting list and to schedule the pt. Writer stated that if the pt does not answer the phone call, a message will be left. Writer stated that at that time, the pt has 2 business days to contact the clinic. If the pt does not contact the clinic within that time, they will be removed from the wait list. Sandralee expressed understanding.  She denies being at risk for losing children and denies being pregnant. She denies HIV status of positive.                                   Mental Health History:    Are you currently involved in mental health treatment?  Yes, Where: [facility]    What is your understanding of your mental health diagnosis? "No diagnoses yet"  What mental health medication are you prescribed, if any? denies     Are you currently taking a long acting injectable medication?  No    Will you need an injection at the time of your first intake assessment appointment?   no    Treatment/Substance Use History:    Audit-C  (If total score is 3 or more complete substance abuse assessment or full Audit in Flowsheets)  Denies consuming alcohol    Current Other Substance use: no, if yes, what substance denies current substance use. Previous use of percocet pills.    Quantity and Frequency: see above    Last date of use: roughly a year ago     Currently in Chemical Dependency treatment? Yes. [facility]    Legal System Involvement:    Any current involvement with legal  system? No   Legal contact name: none         Upcoming court/appearance date? No      Have you ever been or are you in the process of being charged with a sexual offense? No    Risk Assessment:    Self Injury: Patient Denies  Suicidal Ideation: Patient Denies  Recent suicide attempts: No    Homicidal Ideation: Patient Denies  Aggressive Behavior: Patient Denies    If yes, plan: Other: n/a    Customer Service:    Interpreter Services:No     Working Diagnosis:  N/a      Intake Scheduling:    (Fee interview 30 minutes prior to scheduled appointment with therapist)    Date: TBD  Time: TBD    ADDED TO WAIT LIST    LENGTH OF SESSION:  8  minutes    Damica Gravlin Su Hilt

## 2023-04-16 ENCOUNTER — Telehealth: Payer: Self-pay

## 2023-04-16 NOTE — Telephone Encounter (Signed)
Writer placed [facility] wait list outreach call to pt and spoke with pt. Pt is still interested in the program and verifies information is still accurate. Writer informed pt referral is needed from counselor at [facility] and pt states she has spoke with counselor about this.

## 2023-05-01 ENCOUNTER — Telehealth: Payer: Self-pay

## 2023-05-01 NOTE — Telephone Encounter (Signed)
Writer placed [facility] wait list outreach call to pt and spoke with pt. Pt is still interested in the program and verifies information is still accurate.

## 2023-05-07 ENCOUNTER — Telehealth: Payer: Self-pay

## 2023-05-07 NOTE — Telephone Encounter (Addendum)
05/07/23 - Pt's name has reached the top of the [facility] wait list. Writer outreached to pt via telephone in attempts of scheduling intake appointment but there was no answer. A voicemail was left with request for a return call within 2 business days, otherwise name will be removed from the wait list.    05/14/23 - Pt has not responded to outreach, therefore name is being removed from [facility] wait list.

## 8387-02-03 DEATH — deceased
